# Patient Record
Sex: Female | Born: 1935 | Race: White | Hispanic: No | Smoking: Never smoker
Health system: Southern US, Community
[De-identification: ages and names within clinical notes are randomized; demographics above are authoritative.]

## PROBLEM LIST (undated history)

## (undated) DIAGNOSIS — R12 Heartburn: Secondary | ICD-10-CM

## (undated) DIAGNOSIS — I2510 Atherosclerotic heart disease of native coronary artery without angina pectoris: Secondary | ICD-10-CM

## (undated) DIAGNOSIS — E7800 Pure hypercholesterolemia, unspecified: Secondary | ICD-10-CM

## (undated) DIAGNOSIS — H409 Unspecified glaucoma: Secondary | ICD-10-CM

## (undated) DIAGNOSIS — K469 Unspecified abdominal hernia without obstruction or gangrene: Secondary | ICD-10-CM

## (undated) DIAGNOSIS — N390 Urinary tract infection, site not specified: Secondary | ICD-10-CM

## (undated) DIAGNOSIS — F419 Anxiety disorder, unspecified: Secondary | ICD-10-CM

## (undated) DIAGNOSIS — N19 Unspecified kidney failure: Secondary | ICD-10-CM

## (undated) DIAGNOSIS — C801 Malignant (primary) neoplasm, unspecified: Secondary | ICD-10-CM

## (undated) DIAGNOSIS — I10 Essential (primary) hypertension: Secondary | ICD-10-CM

## (undated) HISTORY — DX: Unspecified glaucoma: H40.9

## (undated) HISTORY — DX: Heartburn: R12

## (undated) HISTORY — DX: Unspecified kidney failure: N19

## (undated) HISTORY — PX: ABDOMINAL HYSTERECTOMY: SUR658

## (undated) HISTORY — PX: COLON SURGERY: SHX602

## (undated) HISTORY — DX: Anxiety disorder, unspecified: F41.9

## (undated) HISTORY — PX: CATARACT EXTRACTION, BILATERAL: SHX1313

## (undated) HISTORY — PX: TOE SURGERY: SHX1073

## (undated) HISTORY — PX: CHOLECYSTECTOMY: SHX55

---

## 2011-09-22 ENCOUNTER — Encounter (HOSPITAL_COMMUNITY): Payer: Self-pay

## 2011-09-22 ENCOUNTER — Inpatient Hospital Stay (HOSPITAL_COMMUNITY)
Admission: EM | Admit: 2011-09-22 | Discharge: 2011-09-23 | DRG: 313 | Disposition: A | Discharge status: Home / Self Care | Payer: Medicare Other | Attending: Family Medicine | Admitting: Family Medicine

## 2011-09-22 ENCOUNTER — Emergency Department (HOSPITAL_COMMUNITY): Payer: Medicare Other

## 2011-09-22 DIAGNOSIS — Z7902 Long term (current) use of antithrombotics/antiplatelets: Secondary | ICD-10-CM

## 2011-09-22 DIAGNOSIS — N390 Urinary tract infection, site not specified: Secondary | ICD-10-CM | POA: Diagnosis present

## 2011-09-22 DIAGNOSIS — E1122 Type 2 diabetes mellitus with diabetic chronic kidney disease: Secondary | ICD-10-CM

## 2011-09-22 DIAGNOSIS — E875 Hyperkalemia: Secondary | ICD-10-CM | POA: Diagnosis present

## 2011-09-22 DIAGNOSIS — R079 Chest pain, unspecified: Secondary | ICD-10-CM

## 2011-09-22 DIAGNOSIS — K449 Diaphragmatic hernia without obstruction or gangrene: Secondary | ICD-10-CM | POA: Diagnosis present

## 2011-09-22 DIAGNOSIS — I2510 Atherosclerotic heart disease of native coronary artery without angina pectoris: Secondary | ICD-10-CM

## 2011-09-22 DIAGNOSIS — N183 Chronic kidney disease, stage 3 unspecified: Secondary | ICD-10-CM

## 2011-09-22 DIAGNOSIS — Z85038 Personal history of other malignant neoplasm of large intestine: Secondary | ICD-10-CM | POA: Insufficient documentation

## 2011-09-22 DIAGNOSIS — E119 Type 2 diabetes mellitus without complications: Secondary | ICD-10-CM | POA: Diagnosis present

## 2011-09-22 DIAGNOSIS — Z87440 Personal history of urinary (tract) infections: Secondary | ICD-10-CM

## 2011-09-22 DIAGNOSIS — Z79899 Other long term (current) drug therapy: Secondary | ICD-10-CM

## 2011-09-22 DIAGNOSIS — R0789 Other chest pain: Principal | ICD-10-CM | POA: Diagnosis present

## 2011-09-22 DIAGNOSIS — F411 Generalized anxiety disorder: Secondary | ICD-10-CM | POA: Diagnosis present

## 2011-09-22 DIAGNOSIS — Z880 Allergy status to penicillin: Secondary | ICD-10-CM

## 2011-09-22 DIAGNOSIS — E785 Hyperlipidemia, unspecified: Secondary | ICD-10-CM

## 2011-09-22 DIAGNOSIS — I129 Hypertensive chronic kidney disease with stage 1 through stage 4 chronic kidney disease, or unspecified chronic kidney disease: Secondary | ICD-10-CM

## 2011-09-22 DIAGNOSIS — D649 Anemia, unspecified: Secondary | ICD-10-CM | POA: Diagnosis present

## 2011-09-22 DIAGNOSIS — Z905 Acquired absence of kidney: Secondary | ICD-10-CM

## 2011-09-22 DIAGNOSIS — K219 Gastro-esophageal reflux disease without esophagitis: Secondary | ICD-10-CM | POA: Diagnosis present

## 2011-09-22 DIAGNOSIS — Z7982 Long term (current) use of aspirin: Secondary | ICD-10-CM

## 2011-09-22 DIAGNOSIS — I517 Cardiomegaly: Secondary | ICD-10-CM | POA: Diagnosis present

## 2011-09-22 DIAGNOSIS — Z9861 Coronary angioplasty status: Secondary | ICD-10-CM

## 2011-09-22 HISTORY — DX: Malignant (primary) neoplasm, unspecified: C80.1

## 2011-09-22 HISTORY — DX: Urinary tract infection, site not specified: N39.0

## 2011-09-22 HISTORY — DX: Unspecified abdominal hernia without obstruction or gangrene: K46.9

## 2011-09-22 HISTORY — DX: Pure hypercholesterolemia, unspecified: E78.00

## 2011-09-22 HISTORY — DX: Essential (primary) hypertension: I10

## 2011-09-22 HISTORY — DX: Atherosclerotic heart disease of native coronary artery without angina pectoris: I25.10

## 2011-09-22 LAB — DIFFERENTIAL
Basophils Absolute: 0 10*3/uL (ref 0.0–0.1)
Basophils Relative: 0 % (ref 0–1)
Eosinophils Absolute: 0.1 10*3/uL (ref 0.0–0.7)
Eosinophils Relative: 2 % (ref 0–5)
Lymphocytes Relative: 28 % (ref 12–46)
Monocytes Absolute: 0.5 10*3/uL (ref 0.1–1.0)

## 2011-09-22 LAB — COMPREHENSIVE METABOLIC PANEL
AST: 17 U/L (ref 0–37)
CO2: 21 mEq/L (ref 19–32)
Calcium: 9.1 mg/dL (ref 8.4–10.5)
Creatinine, Ser: 1.74 mg/dL — ABNORMAL HIGH (ref 0.50–1.10)
GFR calc non Af Amer: 26 mL/min — ABNORMAL LOW (ref >90)
Total Protein: 6.9 g/dL (ref 6.0–8.3)

## 2011-09-22 LAB — CARDIAC PANEL(CRET KIN+CKTOT+MB+TROPI)
CK, MB: 2.7 ng/mL (ref 0.3–4.0)
CK, MB: 2.7 ng/mL (ref 0.3–4.0)
Troponin I: <0.3 ng/mL (ref <0.30)

## 2011-09-22 LAB — CBC
HCT: 30.8 % — ABNORMAL LOW (ref 36.0–46.0)
MCH: 27.5 pg (ref 26.0–34.0)
MCHC: 32.5 g/dL (ref 30.0–36.0)
MCV: 84.6 fL (ref 78.0–100.0)
RDW: 14.8 % (ref 11.5–15.5)

## 2011-09-22 LAB — GLUCOSE, CAPILLARY: Glucose-Capillary: 97 mg/dL (ref 70–99)

## 2011-09-22 MED ORDER — ONDANSETRON HCL 4 MG PO TABS: 4.0000 mg | ORAL_TABLET | Freq: Four times a day (QID) | ORAL | Status: DC | PRN

## 2011-09-22 MED ORDER — SODIUM POLYSTYRENE SULFONATE 15 GM/60ML PO SUSP
15.0000 g | Freq: Once | ORAL | Status: AC
  Administered 2011-09-22: 15 g via ORAL
  Filled 2011-09-22: qty 60

## 2011-09-22 MED ORDER — LORATADINE 10 MG PO TABS
10.0000 mg | ORAL_TABLET | Freq: Every day | ORAL | Status: DC
  Administered 2011-09-23: 10 mg via ORAL
  Filled 2011-09-22: qty 1

## 2011-09-22 MED ORDER — PANTOPRAZOLE SODIUM 40 MG PO TBEC
80.0000 mg | DELAYED_RELEASE_TABLET | Freq: Every day | ORAL | Status: DC
  Administered 2011-09-23: 80 mg via ORAL
  Filled 2011-09-22 (×2): qty 1

## 2011-09-22 MED ORDER — ACETAMINOPHEN 325 MG PO TABS: 650.0000 mg | ORAL_TABLET | Freq: Four times a day (QID) | ORAL | Status: DC | PRN

## 2011-09-22 MED ORDER — CARVEDILOL PHOSPHATE ER 10 MG PO CP24
10.0000 mg | ORAL_CAPSULE | Freq: Every day | ORAL | Status: DC
  Administered 2011-09-23: 10 mg via ORAL
  Filled 2011-09-22 (×2): qty 1

## 2011-09-22 MED ORDER — ONDANSETRON HCL 4 MG/2ML IJ SOLN: 4.0000 mg | Freq: Four times a day (QID) | INTRAMUSCULAR | Status: DC | PRN

## 2011-09-22 MED ORDER — EZETIMIBE-SIMVASTATIN 10-40 MG PO TABS
1.0000 | ORAL_TABLET | Freq: Every day | ORAL | Status: DC
  Administered 2011-09-23: 1 via ORAL
  Filled 2011-09-22 (×2): qty 1

## 2011-09-22 MED ORDER — ALPRAZOLAM 0.25 MG PO TABS: 0.2500 mg | ORAL_TABLET | Freq: Every day | ORAL | Status: DC | PRN

## 2011-09-22 MED ORDER — ASPIRIN 81 MG PO CHEW
81.0000 mg | CHEWABLE_TABLET | Freq: Every day | ORAL | Status: DC
  Administered 2011-09-23: 81 mg via ORAL
  Filled 2011-09-22: qty 1

## 2011-09-22 MED ORDER — ACETAMINOPHEN 650 MG RE SUPP: 650.0000 mg | Freq: Four times a day (QID) | RECTAL | Status: DC | PRN

## 2011-09-22 MED ORDER — MORPHINE SULFATE 2 MG/ML IJ SOLN: 0.5000 mg | INTRAMUSCULAR | Status: DC | PRN

## 2011-09-22 MED ORDER — NITROFURANTOIN MACROCRYSTAL 100 MG PO CAPS: 100.0000 mg | ORAL_CAPSULE | Freq: Two times a day (BID) | ORAL | Status: DC

## 2011-09-22 MED ORDER — ALUM & MAG HYDROXIDE-SIMETH 200-200-20 MG/5ML PO SUSP: 30.0000 mL | Freq: Four times a day (QID) | ORAL | Status: DC | PRN

## 2011-09-22 MED ORDER — CLOPIDOGREL BISULFATE 75 MG PO TABS
75.0000 mg | ORAL_TABLET | Freq: Every day | ORAL | Status: DC
  Administered 2011-09-23: 75 mg via ORAL
  Filled 2011-09-22 (×2): qty 1

## 2011-09-22 MED ORDER — LINAGLIPTIN 5 MG PO TABS
5.0000 mg | ORAL_TABLET | Freq: Every day | ORAL | Status: DC
  Administered 2011-09-23: 5 mg via ORAL
  Filled 2011-09-22: qty 1

## 2011-09-22 MED ORDER — DOCUSATE SODIUM 100 MG PO CAPS
100.0000 mg | ORAL_CAPSULE | Freq: Two times a day (BID) | ORAL | Status: DC
  Administered 2011-09-23: 100 mg via ORAL
  Filled 2011-09-22 (×2): qty 1

## 2011-09-22 MED ORDER — PIOGLITAZONE HCL 45 MG PO TABS
45.0000 mg | ORAL_TABLET | Freq: Every day | ORAL | Status: DC
  Administered 2011-09-23: 45 mg via ORAL
  Filled 2011-09-22 (×2): qty 1

## 2011-09-22 NOTE — H&P (Signed)
Family Medicine Teaching [facility] Admission History and Physical  Patient name: Amy Chavez Medical record number: [id number] Date of birth: [birth year] Age: 78 y.o. Gender: female  Primary Care Provider: Barbette Reichmann, MD in [facility]  Chief Complaint: Chest Pain History of Present Illness: Amy Chavez is a 78 y.o. year old female with a past medical history of CAD s/p PTCI x3 with 1 stent requiring restenting apprxoimately 4 years ago, DM, HLD, HTN, and solitary kidney with CKD stage III presenting with chest pain.   Patient states she was in her normal state of health today including being out shopping with a family member. When she got home from shopping, she reports it was harder for her to get out of the car than normal due to some mild weakness. After getting out of the car, she went inside and within the next hour (sometime around 2:30pm) she started to feel some aching in her shoulders bilaterally. SHe says the discomfort then shifted to the right side of her chest which she described as a 5/10 tightness. She states she was simply sitting down when this pain started. She says if she tried to take a deep breath, it worsened the pain. Otherwise, no SOB or DOE. She describes no nausea/vomiting/palpitations/diaphoresis. She says she did feel slightly weaker and a family member told her she was slightly more pale. She says this felt the same as the time she found out her stent had re stenosed 4-5 years ago.  Family called EMS. Patient reports the tightness eased off but the pain with deep breaths persisted. She was given nitroglycerin on the way to the hospital which she states did not improve her pain. In the ED, patient noted to have a non elevated d-dimer, chest x-ray with cardiomegaly and hiatal hernia, EKG with normal sinus rhythm and no st/t wave changes, and a first set of negative cardiac enzymes. Family Medicine consulted for chest pain rule out admission.    Past medical  history: Patient Active Problem List  Diagnoses  . Chest pain  . Diabetes mellitus type II  . CAD (coronary artery disease)  . HLD (hyperlipidemia)  . CKD (chronic kidney disease), stage III  . Solitary kidney, acquired  . History of colon cancer  . HTN (hypertension)  History of recurrent UTIs with current UTI being treated History of hiatal hernia   Past Surgical History  Procedure Date  . Colon surgery   . Cholecystectomy   . Abdominal hysterectomy     Family History  Problem Relation Age of Onset  . Heart disease Father    Social History:  reports that she has never smoked. She does not have any smokeless tobacco history on file. She reports that she does not drink alcohol or use illicit drugs.Lives in [place] with daughter and grandchildren. Lived here for last 18 months. Patient independent except for driving.   Allergies:  Allergies  Allergen Reactions  . Penicillins Nausea And Vomiting    Medications Prior to Admission  Medication Dose Route Frequency Provider Last Rate Last Dose  . sodium polystyrene (KAYEXALATE) 15 GM/60ML suspension 15 g  15 g Oral Once Everrett Coombe   15 g at 09/22/11 2150   No current outpatient prescriptions on file as of 09/22/2011.    Results for orders placed during the hospital encounter of 09/22/11 (from the past 48 hour(s))  CBC     Status: Abnormal   Collection Time   09/22/11  4:59 PM      Component Value  Range Comment   WBC 5.4  4.0 - 10.5 (K/uL)    RBC 3.64 (*) 3.87 - 5.11 (MIL/uL)    Hemoglobin 10.0 (*) 12.0 - 15.0 (g/dL)    HCT 16.1 (*) 09.6 - 46.0 (%)    MCV 84.6  78.0 - 100.0 (fL)    MCH 27.5  26.0 - 34.0 (pg)    MCHC 32.5  30.0 - 36.0 (g/dL)    RDW 04.5  40.9 - 81.1 (%)    Platelets 203  150 - 400 (K/uL)   DIFFERENTIAL     Status: Normal   Collection Time   09/22/11  4:59 PM      Component Value Range Comment   Neutrophils Relative 61  43 - 77 (%)    Neutro Abs 3.3  1.7 - 7.7 (K/uL)    Lymphocytes Relative 28   12 - 46 (%)    Lymphs Abs 1.5  0.7 - 4.0 (K/uL)    Monocytes Relative 9  3 - 12 (%)    Monocytes Absolute 0.5  0.1 - 1.0 (K/uL)    Eosinophils Relative 2  0 - 5 (%)    Eosinophils Absolute 0.1  0.0 - 0.7 (K/uL)    Basophils Relative 0  0 - 1 (%)    Basophils Absolute 0.0  0.0 - 0.1 (K/uL)   COMPREHENSIVE METABOLIC PANEL     Status: Abnormal   Collection Time   09/22/11  4:59 PM      Component Value Range Comment   Sodium 135  135 - 145 (mEq/L)    Potassium 5.4 (*) 3.5 - 5.1 (mEq/L)    Chloride 106  96 - 112 (mEq/L)    CO2 21  19 - 32 (mEq/L)    Glucose, Bld 110 (*) 70 - 99 (mg/dL)    BUN 35 (*) 6 - 23 (mg/dL)    Creatinine, Ser 9.14 (*) 0.50 - 1.10 (mg/dL)    Calcium 9.1  8.4 - 10.5 (mg/dL)    Total Protein 6.9  6.0 - 8.3 (g/dL)    Albumin 3.3 (*) 3.5 - 5.2 (g/dL)    AST 17  0 - 37 (U/L)    ALT 11  0 - 35 (U/L)    Alkaline Phosphatase 97  39 - 117 (U/L)    Total Bilirubin 0.2 (*) 0.3 - 1.2 (mg/dL)    GFR calc non Af Amer 26 (*) >90 (mL/min)    GFR calc Af Amer 30 (*) >90 (mL/min)   CARDIAC PANEL(CRET KIN+CKTOT+MB+TROPI)     Status: Normal   Collection Time   09/22/11  4:59 PM      Component Value Range Comment   Total CK 142  7 - 177 (U/L)    CK, MB 2.7  0.3 - 4.0 (ng/mL)    Troponin I <0.30  <0.30 (ng/mL)    Relative Index 1.9  0.0 - 2.5    CARDIAC PANEL(CRET KIN+CKTOT+MB+TROPI)     Status: Normal   Collection Time   09/22/11  6:11 PM      Component Value Range Comment   Total CK 156  7 - 177 (U/L) HEMOLYSIS AT THIS LEVEL MAY AFFECT RESULT   CK, MB 2.7  0.3 - 4.0 (ng/mL)    Troponin I <0.30  <0.30 (ng/mL)    Relative Index 1.7  0.0 - 2.5    D-DIMER, QUANTITATIVE     Status: Normal   Collection Time   09/22/11  7:29 PM      Component  Value Range Comment   D-Dimer, Quant 0.47  0.00 - 0.48 (ug/mL-FEU)   GLUCOSE, CAPILLARY     Status: Normal   Collection Time   09/22/11  9:47 PM      Component Value Range Comment   Glucose-Capillary 97  70 - 99 (mg/dL)    Dg Chest  Portable 1 View  09/22/2011  *RADIOLOGY REPORT*  Clinical Data: Right chest pain, shortness of breath, history hypertension, diabetes  PORTABLE CHEST - 1 VIEW  Comparison: Portable exam 1807 hours without priors for comparison  Findings: Enlargement of cardiac silhouette. Density at inferior mediastinum, question large hiatal hernia. Mildly tortuous aorta. Pulmonary vascularity normal. Lungs grossly clear. No pleural effusion or pneumothorax. Bones demineralized.  IMPRESSION: Enlargement of cardiac silhouette. Question large hiatal hernia.  Original Report Authenticated By: Lollie Marrow, M.D.    ROS negative except as noted in HPI    Blood pressure 153/65, pulse 77, resp. rate 23, height 5\' 8"  (1.727 m), weight 178 lb (80.74 kg), SpO2 99.00%. Physical Exam  Constitutional: She is oriented to person, place, and time. She appears well-developed and well-nourished. No distress.       Elderly female appears younger than stated age  HENT:  Mouth/Throat: Oropharynx is clear and moist.       Some plaque build up between front bottom teeth, also with several filled cavities noted  Eyes: Pupils are equal, round, and reactive to light.  Neck: Normal range of motion. Neck supple. No JVD present. No tracheal deviation present. No thyromegaly present.  Cardiovascular: Normal rate and regular rhythm.  Exam reveals no gallop and no friction rub.   Murmur (2/6 systolic ejection murmur best heard in aortic area) heard. Respiratory: Effort normal and breath sounds normal. No respiratory distress. She has no wheezes. She has no rales. She exhibits no tenderness.  GI: Soft. Bowel sounds are normal. She exhibits no distension. There is no tenderness.  Musculoskeletal: Normal range of motion. She exhibits edema (trace).  Neurological: She is alert and oriented to person, place, and time.  Skin: She is not diaphoretic.     Assessment/Plan  Amy Chavez is a 78 y.o. year old female with a past medical history  of CAD s/p PTCI x3 with 1 stent requiring restenting apprxoimately 4 years ago, DM, HLD, HTN admitted for chest pain ACS rule out.   1. Chest Pain with history of CAD-EKG NSR without st/t wave changes. CXR with cardiomegaly and hiatal hernia. D- dimer not elevated. CE negative x 1 -DDX includes ACS, PE (negative d-dimer), MSK, GERD/GI, referred pain from hiatal hernia,  Anxiety (on prn xanax), PTX (none on CXR) -admit to inpatient and monitor on telemetry *if patient without chest pain in AM, would consider outpatient cardiology vs. If continued chest pain, getting inpatient consult for acute eval and to establish cardiology care  *may also try to contact PCP in AM to update  *f/u cardiac enzymes x 2 *AM EKG.  *will get 2-d echo as patient with soft SEM and cardiomegaly on CXR and no echo for 4-5 years with history of CAD  *will also get BNP *continue ASA *patient also on Plavix so will continue at this time but will need cardiology evaluation for continuation *RIsk stratify with A1c, lipids, TSH  2. Asymptomatic hyperkalemia-will give small dose of kayexalate 15mg  and recheck AM labs.   3. CKD Stage III with solitary kidney-patient reports stage III but current GFR consistent with Stage IV. WIll repeat BMET in AM.  4. Anxiety-will continue once daily xanax 0.25mg  prn. Could be playing into current symptoms.   5. HTN-mildly elevated with systolics into 150 in ED> Will continue coreg tonight. Will hold losartan dose this evening as Cr slightly elevated.   6. HLD-continue home statin  7. DM-will continue pioglitazone and lintagliptin tomorrow morning. Will also obtain a1c  8. GERD-continue PPI  FEN/GI: carb modified. SLIV PPx-heparin SQ Code status-will need to address in AM DIsposition-pending resolution of chest pain and further eval    Tana Conch, MD, PGY1  09/22/2011, 10:36 PM  PGY-2 Addendum  I have seen the patient and agree with history, exam and assessment and  plan.  As above needs establishment with cardiology, if chest pain resolves she could probably do this as outpatient.  Holding Losartan for now as she is Stage III CKD.    Everrett Coombe, DO

## 2011-09-22 NOTE — ED Provider Notes (Signed)
History     CSN: [id number]  Arrival date & time 09/22/11  1630   First MD Initiated Contact with Patient 09/22/11 1634      Chief Complaint  Patient presents with  . Chest Pain    (Consider location/radiation/quality/duration/timing/severity/associated sxs/prior treatment) Patient is a 78 y.o. female presenting with chest pain. The history is provided by the patient (Patient complains of chest pain today patient also complains of some weakness and shortness of breath. She states that she has a history of dense. And has not had them looked at for about 5 years). No language interpreter was used.  Chest Pain The chest pain began less than 1 hour ago. Chest pain occurs intermittently. The chest pain is improving. The pain is associated with breathing. At its most intense, the pain is at 5/10. The pain is currently at 4/10. The severity of the pain is moderate. The quality of the pain is described as aching. The pain does not radiate. Pertinent negatives for primary symptoms include no fever, no fatigue, no cough and no abdominal pain. She tried nothing for the symptoms. Risk factors include no known risk factors.  Her past medical history is significant for aneurysm.  Pertinent negatives for past medical history include no seizures.  Pertinent negatives for family medical history include: family history of aortic dissection.     Past Medical History  Diagnosis Date  . Diabetes mellitus   . Cancer   . Coronary artery disease   . Hypertension   . Hernia   . Hypercholesterolemia   . UTI (urinary tract infection)     Past Surgical History  Procedure Date  . Colon surgery     No family history on file.  History  Substance Use Topics  . Smoking status: Never Smoker   . Smokeless tobacco: Not on file  . Alcohol Use: No    OB History    Grav Para Term Preterm Abortions TAB SAB Ect Mult Living                  Review of Systems  Constitutional: Negative for fever and  fatigue.  HENT: Negative for congestion, sinus pressure and ear discharge.   Eyes: Negative for discharge.  Respiratory: Negative for cough.   Cardiovascular: Positive for chest pain.  Gastrointestinal: Negative for abdominal pain and diarrhea.  Genitourinary: Negative for frequency and hematuria.  Musculoskeletal: Negative for back pain.  Skin: Negative for rash.  Neurological: Negative for seizures and headaches.  Hematological: Negative.   Psychiatric/Behavioral: Negative for hallucinations.    Allergies  Penicillins  Home Medications   Current Outpatient Rx  Name Route Sig Dispense Refill  . ALPRAZOLAM 0.25 MG PO TABS Oral Take 0.25 mg by mouth daily as needed. For anxiety    . PRO-STAT RENAL CARE HIGH FIBER PO Oral Take 1 tablet by mouth daily.    . ASPIRIN 81 MG PO CHEW Oral Chew 81 mg by mouth daily.    Marland Kitchen CARVEDILOL PHOSPHATE ER 10 MG PO CP24 Oral Take 10 mg by mouth at bedtime.    . CLOPIDOGREL BISULFATE 75 MG PO TABS Oral Take 75 mg by mouth daily.    . CYANOCOBALAMIN 1000 MCG/ML IJ SOLN Intramuscular Inject 1,000 mcg into the muscle every 30 (thirty) days.    Marland Kitchen ESOMEPRAZOLE MAGNESIUM 40 MG PO CPDR Oral Take 40 mg by mouth daily before breakfast.    . EZETIMIBE-SIMVASTATIN 10-40 MG PO TABS Oral Take 1 tablet by mouth at bedtime.    Marland Kitchen  FERROUS GLUCONATE 325 MG PO TABS Oral Take 325 mg by mouth daily with breakfast.    . FEXOFENADINE HCL 180 MG PO TABS Oral Take 180 mg by mouth daily.    Marland Kitchen LOSARTAN POTASSIUM 25 MG PO TABS Oral Take 25 mg by mouth every other day.    Marland Kitchen NITROFURANTOIN MACROCRYSTAL 100 MG PO CAPS Oral Take 100 mg by mouth 2 (two) times daily.    Marland Kitchen PIOGLITAZONE HCL 45 MG PO TABS Oral Take 45 mg by mouth daily.    Marland Kitchen SITAGLIPTIN PHOSPHATE 100 MG PO TABS Oral Take 100 mg by mouth daily.      BP 138/51  Pulse 69  Resp 13  Ht 5\' 8"  (1.727 m)  Wt 178 lb (80.74 kg)  BMI 27.06 kg/m2  SpO2 100%  Physical Exam  Constitutional: She is oriented to person, place,  and time. She appears well-developed.  HENT:  Head: Normocephalic and atraumatic.  Eyes: Conjunctivae and EOM are normal. No scleral icterus.  Neck: Neck supple. No thyromegaly present.  Cardiovascular: Normal rate and regular rhythm.  Exam reveals no gallop and no friction rub.   No murmur heard. Pulmonary/Chest: No stridor. She has no wheezes. She has no rales. She exhibits no tenderness.  Abdominal: She exhibits no distension. There is no tenderness. There is no rebound.  Musculoskeletal: Normal range of motion. She exhibits no edema.  Lymphadenopathy:    She has no cervical adenopathy.  Neurological: She is oriented to person, place, and time. Coordination normal.  Skin: No rash noted. No erythema.  Psychiatric: She has a normal mood and affect. Her behavior is normal.    ED Course  Procedures (including critical care time)  Labs Reviewed  CBC - Abnormal; Notable for the following:    RBC 3.64 (*)    Hemoglobin 10.0 (*)    HCT 30.8 (*)    All other components within normal limits  COMPREHENSIVE METABOLIC PANEL - Abnormal; Notable for the following:    Potassium 5.4 (*)    Glucose, Bld 110 (*)    BUN 35 (*)    Creatinine, Ser 1.74 (*)    Albumin 3.3 (*)    Total Bilirubin 0.2 (*)    GFR calc non Af Amer 26 (*)    GFR calc Af Amer 30 (*)    All other components within normal limits  DIFFERENTIAL  CARDIAC PANEL(CRET KIN+CKTOT+MB+TROPI)  CARDIAC PANEL(CRET KIN+CKTOT+MB+TROPI)  D-DIMER, QUANTITATIVE   Dg Chest Portable 1 View  09/22/2011  *RADIOLOGY REPORT*  Clinical Data: Right chest pain, shortness of breath, history hypertension, diabetes  PORTABLE CHEST - 1 VIEW  Comparison: Portable exam 1807 hours without priors for comparison  Findings: Enlargement of cardiac silhouette. Density at inferior mediastinum, question large hiatal hernia. Mildly tortuous aorta. Pulmonary vascularity normal. Lungs grossly clear. No pleural effusion or pneumothorax. Bones demineralized.   IMPRESSION: Enlargement of cardiac silhouette. Question large hiatal hernia.  Original Report Authenticated By: Lollie Marrow, M.D.     1. Chest pain     Date: 09/22/2011  Rate: 80  Rhythm: normal sinus rhythm  QRS Axis: normal  Intervals: normal  ST/T Wave abnormalities: normal  Conduction Disutrbances:none  Narrative Interpretation:   Old EKG Reviewed: none available     MDM          Benny Lennert, MD 09/22/11 2100

## 2011-09-22 NOTE — ED Notes (Signed)
Pt reports recent diagnosis of UTI, was placed on macrobid yesterday, has taken 2 doses so far.  Pt reports pain is 5/10, reports pain radiates from chest to scapula intermittently, reports pain increases with deep inspiration, pain is not re-producible with palpation.

## 2011-09-22 NOTE — ED Notes (Signed)
Pt resting quietly with family at bedside. Pt denies any pain or shortness of breath at this time. Plan of care is updated with verbal understanding. Pt is awaiting test results for final disposition. Call bell in reach, will continue to monitor pt.

## 2011-09-22 NOTE — ED Notes (Signed)
[id number] Ready 

## 2011-09-22 NOTE — ED Notes (Signed)
Attempted to call report, RN unavailable. RN Merlyn Albert to call back for pt report.

## 2011-09-22 NOTE — ED Notes (Signed)
Pt presents with onset of midscapular pain that radiated into R side of chest and into L side. -shortness of breath or nausea.  Pt reports she was sitting at the time, reports onset of weakness while getting out of car and had to get assistance to get out.

## 2011-09-22 NOTE — ED Notes (Signed)
Pt ambulatory to restroom with steady gait, pt denies any complaints or pain upon return to room. Pt placed back on cardiac monitoring and request not to be on oxygen at this time. Pt sats 98% on RA. Pt CBG was checked with results of 97, pt given meal tray. Plan of care is updated with verbal understanding, pt is awaiting inpt bed assignment. Will continue to monitor pt.

## 2011-09-23 ENCOUNTER — Encounter (HOSPITAL_COMMUNITY): Payer: Self-pay | Admitting: *Deleted

## 2011-09-23 ENCOUNTER — Other Ambulatory Visit: Payer: Self-pay

## 2011-09-23 DIAGNOSIS — R0789 Other chest pain: Secondary | ICD-10-CM

## 2011-09-23 DIAGNOSIS — N189 Chronic kidney disease, unspecified: Secondary | ICD-10-CM

## 2011-09-23 DIAGNOSIS — I2510 Atherosclerotic heart disease of native coronary artery without angina pectoris: Secondary | ICD-10-CM

## 2011-09-23 DIAGNOSIS — E1029 Type 1 diabetes mellitus with other diabetic kidney complication: Secondary | ICD-10-CM

## 2011-09-23 DIAGNOSIS — I369 Nonrheumatic tricuspid valve disorder, unspecified: Secondary | ICD-10-CM

## 2011-09-23 LAB — BASIC METABOLIC PANEL
Chloride: 109 mEq/L (ref 96–112)
Creatinine, Ser: 1.69 mg/dL — ABNORMAL HIGH (ref 0.50–1.10)
GFR calc Af Amer: 31 mL/min — ABNORMAL LOW (ref >90)

## 2011-09-23 LAB — CBC
HCT: 30.4 % — ABNORMAL LOW (ref 36.0–46.0)
Hemoglobin: 9.5 g/dL — ABNORMAL LOW (ref 12.0–15.0)
Hemoglobin: 9.6 g/dL — ABNORMAL LOW (ref 12.0–15.0)
MCHC: 31.7 g/dL (ref 30.0–36.0)
Platelets: 196 10*3/uL (ref 150–400)
RDW: 14.8 % (ref 11.5–15.5)
RDW: 14.9 % (ref 11.5–15.5)
WBC: 4.7 10*3/uL (ref 4.0–10.5)

## 2011-09-23 LAB — CARDIAC PANEL(CRET KIN+CKTOT+MB+TROPI)
CK, MB: 2.6 ng/mL (ref 0.3–4.0)
CK, MB: 2.6 ng/mL (ref 0.3–4.0)
Troponin I: <0.3 ng/mL (ref <0.30)

## 2011-09-23 LAB — CREATININE, SERUM
GFR calc Af Amer: 28 mL/min — ABNORMAL LOW (ref >90)
GFR calc non Af Amer: 24 mL/min — ABNORMAL LOW (ref >90)

## 2011-09-23 LAB — HEMOGLOBIN A1C
Hgb A1c MFr Bld: 6.4 % — ABNORMAL HIGH (ref <5.7)
Mean Plasma Glucose: 137 mg/dL — ABNORMAL HIGH (ref <117)

## 2011-09-23 LAB — GLUCOSE, CAPILLARY
Glucose-Capillary: 102 mg/dL — ABNORMAL HIGH (ref 70–99)
Glucose-Capillary: 104 mg/dL — ABNORMAL HIGH (ref 70–99)

## 2011-09-23 LAB — PRO B NATRIURETIC PEPTIDE: Pro B Natriuretic peptide (BNP): 295.8 pg/mL (ref 0–450)

## 2011-09-23 LAB — LIPID PANEL
Cholesterol: 123 mg/dL (ref 0–200)
Triglycerides: 87 mg/dL (ref <150)

## 2011-09-23 MED ORDER — CEPHALEXIN 500 MG PO CAPS: 500.0000 mg | ORAL_CAPSULE | Freq: Three times a day (TID) | ORAL | Status: AC

## 2011-09-23 MED ORDER — HEPARIN SODIUM (PORCINE) 5000 UNIT/ML IJ SOLN
5000.0000 [IU] | Freq: Three times a day (TID) | INTRAMUSCULAR | Status: DC
  Administered 2011-09-23: 5000 [IU] via SUBCUTANEOUS
  Filled 2011-09-23 (×4): qty 1

## 2011-09-23 MED ORDER — CEPHALEXIN 500 MG PO CAPS: 500.0000 mg | ORAL_CAPSULE | Freq: Three times a day (TID) | ORAL | Status: DC

## 2011-09-23 NOTE — Progress Notes (Signed)
Echocardiogram 2D Echocardiogram has been performed.  Amy Chavez 09/23/2011, 12:18 PM

## 2011-09-23 NOTE — Discharge Summary (Signed)
Physician Discharge Summary  Patient ID: Amy Chavez MRN: [id number] DOB: 1925/12/02 Age: 78 y.o.  Admit date: 09/22/2011 Discharge date: 09/25/2011  PCP: Barbette Reichmann, MD of [place], [place]     Discharge Diagnosis: Principal Problem:  *Chest pain Active Problems:  Diabetes mellitus type II  CAD (coronary artery disease)  HLD (hyperlipidemia)  CKD (chronic kidney disease), stage III  Solitary kidney, acquired  HTN (hypertension)    Hospital Course Amy Chavez is a 78 y.o. year old female with a past medical history of CAD s/p PTCI x3 with 1 stent requiring restenting approximately 4 years ago, DM, HLD, HTN admitted for chest pain ACS rule out.  1. Chest Pain with history of CAD-EKG NSR without st/t wave changes. CXR with cardiomegaly and hiatal hernia. D- dimer not elevated. CE negative x 3. Resolved on night of admission. Nitro without relief in ED.  -DDX includes ACS, PE (negative d-dimer), MSK, GERD/GI, referred pain from hiatal hernia, Anxiety (on prn xanax), PTX (none on CXR). Would favor MSK at this point given primarily with deep inspiration and patient admitting to some anxiety associated with feeling the pain, but patient would still benefit from being established with cardiology.  *attempted to contact PCP to see if he would want [place] to establish cardiology care in hospital but PCP was unavailable.  *2-d echo ordered given history of CAD and SEM: echo with EF 55-60%, no mention of diastolic dysfunction and no valve abnormalities. Patient left before 2-d echo results finalized. I have routed a copy of the echo to the PCP as well as included in this document *continue ASA  *patient also on Plavix so will continue at this time but will need cardiology evaluation for continuation  *LDL well controlled at 44, TSH wnl, A1c 6.4. Also had BNP not elevated at <300   2. Asymptomatic hyperkalemia-resolved with small dose of kayexalate 15mg   3. CKD Stage III with solitary  kidney-patient reports stage III but current GFR consistent with Stage IV. Cr at admission was 1.74 now 1.69. Will not send patient home on januvia and losartan and patient to f/u with PCP on Monday.  4. Anxiety-will continue once daily xanax 0.25mg  prn. Could be playing into current chest pain symptoms.  5. HTN-controlled this morning at 131/58. On coreg. Will continue to hold losartan due to CKD.  6. HLD-continue home statin. Well controlled  7. DM-will continue pioglitazone and lintagliptinin house.  Lab Results  Component Value Date   HGBA1C 6.4* 09/23/2011   8. GERD-continue PPI  9. Normocytic Anemia-unchanged since admission-will need to monitor outpatient.  10. Outpatient UTI-did continue nitrofurantoin initially but crcl less than 50 so stopped. Called PCP and no culture available so switched to Keflex.     Procedures/Imaging:  Dg Chest Portable 1 View  09/22/2011  *RADIOLOGY REPORT*  Clinical Data: Right chest pain, shortness of breath, history hypertension, diabetes  PORTABLE CHEST - 1 VIEW  Comparison: Portable exam 1807 hours without priors for comparison  Findings: Enlargement of cardiac silhouette. Density at inferior mediastinum, question large hiatal hernia. Mildly tortuous aorta. Pulmonary vascularity normal. Lungs grossly clear. No pleural effusion or pneumothorax. Bones demineralized.  IMPRESSION: Enlargement of cardiac silhouette. Question large hiatal hernia.  Original Report Authenticated By: Lollie Marrow, M.D.   ------------------------------------------------------------ 2-D Echo Study Conclusions  - Left ventricle: The cavity size was normal. Systolic function was normal. The estimated ejection fraction was in the range of 55% to 60%. Wall motion was normal; there were no regional wall motion abnormalities. -  Aortic valve: Not well seen likely trileaflet - Atrial septum: No defect or patent foramen ovale was identified. Transthoracic echocardiography. M-mode,  complete 2D, spectral Doppler, and color Doppler. Height: Height: 172.7cm. Height: 68in. Weight: Weight: 81.6kg. Weight: 179.6lb. Body mass index: BMI: 27.4kg/m^2. Body surface area: BSA: 1.76m^2. Blood pressure: 137/72. Patient status: Inpatient. Location: Echo laboratory.  ------------------------------------------------------------  ------------------------------------------------------------ Left ventricle: The cavity size was normal. Systolic function was normal. The estimated ejection fraction was in the range of 55% to 60%. Wall motion was normal; there were no regional wall motion abnormalities.  ------------------------------------------------------------ Aortic valve: Not well seen likely trileaflet Trileaflet; normal thickness, mildly calcified leaflets. Mobility was not restricted. Doppler: Transvalvular velocity was within the normal range. There was no stenosis. No regurgitation.  ------------------------------------------------------------ Aorta: The aorta was normal, not dilated, and non-diseased. Aortic root: The aortic root was normal in size.  ------------------------------------------------------------ Mitral valve: Structurally normal valve. Mobility was not restricted. Doppler: Transvalvular velocity was within the normal range. There was no evidence for stenosis. Trivial regurgitation.  ------------------------------------------------------------ Left atrium: The atrium was normal in size.  ------------------------------------------------------------ Atrial septum: No defect or patent foramen ovale was identified.  ------------------------------------------------------------ Right ventricle: The cavity size was normal. Wall thickness was normal. Systolic function was normal.  ------------------------------------------------------------ Pulmonic valve: Doppler: Transvalvular velocity was within the normal range. There was no evidence for  stenosis. Trivial regurgitation.  ------------------------------------------------------------ Tricuspid valve: Structurally normal valve. Doppler: Transvalvular velocity was within the normal range. Mild regurgitation.  ------------------------------------------------------------ Pulmonary artery: The main pulmonary artery was normal-sized. Systolic pressure was within the normal range.  ------------------------------------------------------------ Right atrium: The atrium was normal in size.  ------------------------------------------------------------ Pericardium: The pericardium was normal in appearance. There was no pericardial effusion.  ------------------------------------------------------------ Systemic veins: Inferior vena cava: The vessel was normal in size.  ------------------------------------------------------------ Post procedure conclusions Ascending Aorta:  - The aorta was normal, not dilated, and non-diseased.     Labs  CBC  Lab 09/23/11 0440 09/23/11 0003 09/22/11 1659  WBC 4.7 4.7 5.4  HGB 9.6* 9.5* 10.0*  HCT 30.3* 30.4* 30.8*  PLT 196 198 203   BMET  Lab 09/23/11 0440 09/23/11 0003 09/22/11 1659  NA 139 -- 135  K 4.6 -- 5.4*  CL 109 -- 106  CO2 23 -- 21  BUN 33* -- 35*  CREATININE 1.69* 1.83* 1.74*  CALCIUM 9.1 -- 9.1  PROT -- -- 6.9  BILITOT -- -- 0.2*  ALKPHOS -- -- 97  ALT -- -- 11  AST -- -- 17  GLUCOSE 76 -- 110*   Results for orders placed during the hospital encounter of 09/22/11 (from the past 72 hour(s))  CBC     Status: Abnormal   Collection Time   09/22/11  4:59 PM      Component Value Range Comment   WBC 5.4  4.0 - 10.5 (K/uL)    RBC 3.64 (*) 3.87 - 5.11 (MIL/uL)    Hemoglobin 10.0 (*) 12.0 - 15.0 (g/dL)    HCT 47.8 (*) 29.5 - 46.0 (%)    MCV 84.6  78.0 - 100.0 (fL)    MCH 27.5  26.0 - 34.0 (pg)    MCHC 32.5  30.0 - 36.0 (g/dL)    RDW 62.1  30.8 - 65.7 (%)    Platelets 203  150 - 400 (K/uL)   DIFFERENTIAL      Status: Normal   Collection Time   09/22/11  4:59 PM      Component Value Range Comment   Neutrophils  Relative 61  43 - 77 (%)    Neutro Abs 3.3  1.7 - 7.7 (K/uL)    Lymphocytes Relative 28  12 - 46 (%)    Lymphs Abs 1.5  0.7 - 4.0 (K/uL)    Monocytes Relative 9  3 - 12 (%)    Monocytes Absolute 0.5  0.1 - 1.0 (K/uL)    Eosinophils Relative 2  0 - 5 (%)    Eosinophils Absolute 0.1  0.0 - 0.7 (K/uL)    Basophils Relative 0  0 - 1 (%)    Basophils Absolute 0.0  0.0 - 0.1 (K/uL)   COMPREHENSIVE METABOLIC PANEL     Status: Abnormal   Collection Time   09/22/11  4:59 PM      Component Value Range Comment   Sodium 135  135 - 145 (mEq/L)    Potassium 5.4 (*) 3.5 - 5.1 (mEq/L)    Chloride 106  96 - 112 (mEq/L)    CO2 21  19 - 32 (mEq/L)    Glucose, Bld 110 (*) 70 - 99 (mg/dL)    BUN 35 (*) 6 - 23 (mg/dL)    Creatinine, Ser 8.46 (*) 0.50 - 1.10 (mg/dL)    Calcium 9.1  8.4 - 10.5 (mg/dL)    Total Protein 6.9  6.0 - 8.3 (g/dL)    Albumin 3.3 (*) 3.5 - 5.2 (g/dL)    AST 17  0 - 37 (U/L)    ALT 11  0 - 35 (U/L)    Alkaline Phosphatase 97  39 - 117 (U/L)    Total Bilirubin 0.2 (*) 0.3 - 1.2 (mg/dL)    GFR calc non Af Amer 26 (*) >90 (mL/min)    GFR calc Af Amer 30 (*) >90 (mL/min)   CARDIAC PANEL(CRET KIN+CKTOT+MB+TROPI)     Status: Normal   Collection Time   09/22/11  4:59 PM      Component Value Range Comment   Total CK 142  7 - 177 (U/L)    CK, MB 2.7  0.3 - 4.0 (ng/mL)    Troponin I <0.30  <0.30 (ng/mL)    Relative Index 1.9  0.0 - 2.5    CARDIAC PANEL(CRET KIN+CKTOT+MB+TROPI)     Status: Normal   Collection Time   09/22/11  6:11 PM      Component Value Range Comment   Total CK 156  7 - 177 (U/L) HEMOLYSIS AT THIS LEVEL MAY AFFECT RESULT   CK, MB 2.7  0.3 - 4.0 (ng/mL)    Troponin I <0.30  <0.30 (ng/mL)    Relative Index 1.7  0.0 - 2.5    D-DIMER, QUANTITATIVE     Status: Normal   Collection Time   09/22/11  7:29 PM      Component Value Range Comment   D-Dimer, Quant 0.47  0.00  - 0.48 (ug/mL-FEU)   GLUCOSE, CAPILLARY     Status: Normal   Collection Time   09/22/11  9:47 PM      Component Value Range Comment   Glucose-Capillary 97  70 - 99 (mg/dL)   CBC     Status: Abnormal   Collection Time   09/23/11 12:03 AM      Component Value Range Comment   WBC 4.7  4.0 - 10.5 (K/uL)    RBC 3.56 (*) 3.87 - 5.11 (MIL/uL)    Hemoglobin 9.5 (*) 12.0 - 15.0 (g/dL)    HCT 96.2 (*) 95.2 - 46.0 (%)    MCV 85.4  78.0 - 100.0 (fL)    MCH 26.7  26.0 - 34.0 (pg)    MCHC 31.3  30.0 - 36.0 (g/dL)    RDW 16.1  09.6 - 04.5 (%)    Platelets 198  150 - 400 (K/uL)   CREATININE, SERUM     Status: Abnormal   Collection Time   09/23/11 12:03 AM      Component Value Range Comment   Creatinine, Ser 1.83 (*) 0.50 - 1.10 (mg/dL)    GFR calc non Af Amer 24 (*) >90 (mL/min)    GFR calc Af Amer 28 (*) >90 (mL/min)   PRO B NATRIURETIC PEPTIDE     Status: Normal   Collection Time   09/23/11 12:03 AM      Component Value Range Comment   Pro B Natriuretic peptide (BNP) 295.8  0 - 450 (pg/mL)   TSH     Status: Normal   Collection Time   09/23/11 12:03 AM      Component Value Range Comment   TSH 2.642  0.350 - 4.500 (uIU/mL)   HEMOGLOBIN A1C     Status: Abnormal   Collection Time   09/23/11 12:03 AM      Component Value Range Comment   Hemoglobin A1C 6.4 (*) <5.7 (%)    Mean Plasma Glucose 137 (*) <117 (mg/dL)   BASIC METABOLIC PANEL     Status: Abnormal   Collection Time   09/23/11  4:40 AM      Component Value Range Comment   Sodium 139  135 - 145 (mEq/L)    Potassium 4.6  3.5 - 5.1 (mEq/L)    Chloride 109  96 - 112 (mEq/L)    CO2 23  19 - 32 (mEq/L)    Glucose, Bld 76  70 - 99 (mg/dL)    BUN 33 (*) 6 - 23 (mg/dL)    Creatinine, Ser 4.09 (*) 0.50 - 1.10 (mg/dL)    Calcium 9.1  8.4 - 10.5 (mg/dL)    GFR calc non Af Amer 27 (*) >90 (mL/min)    GFR calc Af Amer 31 (*) >90 (mL/min)   CARDIAC PANEL(CRET KIN+CKTOT+MB+TROPI)     Status: Normal   Collection Time   09/23/11  4:40 AM       Component Value Range Comment   Total CK 132  7 - 177 (U/L)    CK, MB 2.6  0.3 - 4.0 (ng/mL)    Troponin I <0.30  <0.30 (ng/mL)    Relative Index 2.0  0.0 - 2.5    CBC     Status: Abnormal   Collection Time   09/23/11  4:40 AM      Component Value Range Comment   WBC 4.7  4.0 - 10.5 (K/uL)    RBC 3.59 (*) 3.87 - 5.11 (MIL/uL)    Hemoglobin 9.6 (*) 12.0 - 15.0 (g/dL)    HCT 81.1 (*) 91.4 - 46.0 (%)    MCV 84.4  78.0 - 100.0 (fL)    MCH 26.7  26.0 - 34.0 (pg)    MCHC 31.7  30.0 - 36.0 (g/dL)    RDW 78.2  95.6 - 21.3 (%)    Platelets 196  150 - 400 (K/uL)   LIPID PANEL     Status: Normal   Collection Time   09/23/11  4:40 AM      Component Value Range Comment   Cholesterol 123  0 - 200 (mg/dL)    Triglycerides 87  <086 (mg/dL)  HDL 62  >39 (mg/dL)    Total CHOL/HDL Ratio 2.0      VLDL 17  0 - 40 (mg/dL)    LDL Cholesterol 44  0 - 99 (mg/dL)   GLUCOSE, CAPILLARY     Status: Normal   Collection Time   09/23/11  6:07 AM      Component Value Range Comment   Glucose-Capillary 75  70 - 99 (mg/dL)    Comment 1 Notify RN     CARDIAC PANEL(CRET KIN+CKTOT+MB+TROPI)     Status: Normal   Collection Time   09/23/11  8:23 AM      Component Value Range Comment   Total CK 133  7 - 177 (U/L)    CK, MB 2.6  0.3 - 4.0 (ng/mL)    Troponin I <0.30  <0.30 (ng/mL)    Relative Index 2.0  0.0 - 2.5    GLUCOSE, CAPILLARY     Status: Abnormal   Collection Time   09/23/11 12:00 PM      Component Value Range Comment   Glucose-Capillary 102 (*) 70 - 99 (mg/dL)   GLUCOSE, CAPILLARY     Status: Abnormal   Collection Time   09/23/11  3:58 PM      Component Value Range Comment   Glucose-Capillary 104 (*) 70 - 99 (mg/dL)    Comment 1 Notify RN          Patient condition at time of discharge/disposition: stable  Disposition-home   Follow up issues: 1. januvia and losartan held due to Cr elevated with GFR noted at Stage IV level. Please check BMET before restarting medications. Also switched patient  to keflex from nitrofurantoin for UTI due to CrCl <50. If GFR has truly progressed to stage IV, may benefit from consult with renal.  2. Please have patient established with cardiologist in [facility]. History of CAD with 3 stents without cardiology follow up.  3. She is still on Plavix and unsure of current indication for continuation given last stent placed over 5 years ago.    Discharge follow up:  Follow-up Information    Follow up with [facility] on 09/26/2011. (fior your regularly scheduled appointment and hospital follow up)    Contact information:   [address] [place] [address] [phone]         Discharge Orders    Future Orders Please Complete By Expires   Diet - low sodium heart healthy      Increase activity slowly      Call MD for:  persistant nausea and vomiting      Call MD for:      Scheduling Instructions:   New chest pain.      Discharge Medications  Amy Chavez, Amy Chavez  Home Medication Instructions [id number]   Printed on:09/25/11 1623  Medication Information                    aspirin 81 MG chewable tablet Chew 81 mg by mouth daily.           clopidogrel (PLAVIX) 75 MG tablet Take 75 mg by mouth daily.           pioglitazone (ACTOS) 45 MG tablet Take 45 mg by mouth daily.           ezetimibe-simvastatin (VYTORIN) 10-40 MG per tablet Take 1 tablet by mouth at bedtime.           carvedilol (COREG CR) 10 MG 24 hr capsule Take 10  mg by mouth at bedtime.           ferrous gluconate (FERGON) 325 MG tablet Take 325 mg by mouth daily with breakfast.           fexofenadine (ALLEGRA) 180 MG tablet Take 180 mg by mouth daily.           esomeprazole (NEXIUM) 40 MG capsule Take 40 mg by mouth daily before breakfast.           cyanocobalamin (,VITAMIN B-12,) 1000 MCG/ML injection Inject 1,000 mcg into the muscle every 30 (thirty) days.           ALPRAZolam (XANAX) 0.25 MG tablet Take 0.25 mg by mouth daily as  needed. For anxiety           Amino Acids-Protein Hydrolys (PRO-STAT RENAL CARE HIGH FIBER PO) Take 1 tablet by mouth daily.           cephALEXin (KEFLEX) 500 MG capsule Take 1 capsule (500 mg total) by mouth 3 (three) times daily.                Tana Conch, MD of [facility] 09/25/2011 4:23 PM

## 2011-09-23 NOTE — Progress Notes (Signed)
I discussed the care plan with Dr. Durene Cal and the [facility] team and agree with assessment and plan as documented in the progress note for today.    Johnanthony Wilden A. [name], MD Family Medicine Teaching Service Attending  09/23/2011 7:37 PM

## 2011-09-23 NOTE — H&P (Signed)
Family Medicine Teaching Service Attending Note  I interviewed and examined patient Amy Chavez and reviewed their tests and x-rays.  I discussed with Dr. Jerolyn Center and reviewed their note for today.  I agree with their assessment and plan.     Additionally  Currently feels well no shortness of breath or chest pain Ate all breakfast Agree with plan.   If can ambulate without pain and all labs are normal can discharge other wise need to consult cardiology in patient  Suggest she establish care with a cardiologist either in [place] or [place] ASAP

## 2011-09-23 NOTE — Progress Notes (Signed)
09/23/11  Nursing 1716 DC IV, DC Tele, DC Home. Discharge instructions and home medications discussed with patient. Patient denies any questions or concern at this time. Patient leaving unit ambulatory and appears in no acute distress. Ernesta Amble, RN

## 2011-09-23 NOTE — Progress Notes (Signed)
PGY-1 Daily Progress Note Family Medicine Teaching Service Aldine Contes. Marti Sleigh, MD Service Pager: [phone]   Subjective: No chest pain this morning even with deep inspiration. Says went away around 10:30pm last night.   Objective:  Temp:  [97.5 F (36.4 C)-97.9 F (36.6 C)] 97.5 F (36.4 C) (01/11 0611) Pulse Rate:  [67-80] 67  (01/11 0611) Resp:  [12-23] 18  (01/11 0611) BP: (131-157)/(47-65) 131/58 mmHg (01/11 0611) SpO2:  [98 %-100 %] 98 % (01/11 0611) Weight:  [178 lb (80.74 kg)-180 lb 12.4 oz (82 kg)] 180 lb 5.4 oz (81.8 kg) (01/11 0233)  Intake/Output Summary (Last 24 hours) at 09/23/11 0759 Last data filed at 09/23/11 0700  Gross per 24 hour  Intake    600 ml  Output    300 ml  Net    300 ml    Constitutional: She is oriented to person, place, and time. She appears well-developed and well-nourished. No distress. No longer on oxygen Elderly female appears younger than stated age  HEENT: MMM Neck: Normal range of motion. Neck supple. No JVD present. No tracheal deviation present. No thyromegaly present.  Cardiovascular: Normal rate and regular rhythm. Exam reveals no gallop and no friction rub.  Murmur (2/6 systolic ejection murmur best heard in aortic area) heard.  Respiratory: Effort normal and breath sounds normal. No respiratory distress. She has no wheezes. She has no rales. She exhibits no tenderness.  GI: Soft. Bowel sounds are normal. She exhibits no distension. There is no tenderness.  Musculoskeletal: Normal range of motion. She exhibits edema (trace).  Neurological: She is alert and oriented to person, place, and time.    Labs and imaging:   CBC  Lab 09/23/11 0440 09/23/11 0003 09/22/11 1659  WBC 4.7 4.7 5.4  HGB 9.6* 9.5* 10.0*  HCT 30.3* 30.4* 30.8*  PLT 196 198 203   BMET  Lab 09/23/11 0440 09/23/11 0003 09/22/11 1659  NA 139 -- 135  K 4.6 -- 5.4*  CL 109 -- 106  CO2 23 -- 21  BUN 33* -- 35*  CREATININE 1.69* 1.83* 1.74*  LABGLOM -- -- --   GLUCOSE 76 -- --  CALCIUM 9.1 -- 9.1    Lab 09/23/11 0440 09/22/11 1811 09/22/11 1659  CKTOTAL 132 156 142  TROPONINI <0.30 <0.30 <0.30  TROPONINT -- -- --  CKMBINDEX -- -- --    D-DIMER, QUANTITATIVE     Status: Normal   Collection Time   09/22/11  7:29 PM      Component Value Range   D-Dimer, Quant 0.47  0.00 - 0.48 (ug/mL-FEU)  PRO B NATRIURETIC PEPTIDE     Status: Normal   Collection Time   09/23/11 12:03 AM      Component Value Range   Pro B Natriuretic peptide (BNP) 295.8  0 - 450 (pg/mL)  LIPID PANEL     Status: Normal   Collection Time   09/23/11  4:40 AM      Component Value Range   Cholesterol 123  0 - 200 (mg/dL)   Triglycerides 87  <829 (mg/dL)   HDL 62  >56 (mg/dL)   Total CHOL/HDL Ratio 2.0     VLDL 17  0 - 40 (mg/dL)   LDL Cholesterol 44  0 - 99 (mg/dL)   Dg Chest Portable 1 View  09/22/2011  *RADIOLOGY REPORT*  Clinical Data: Right chest pain, shortness of breath, history hypertension, diabetes  PORTABLE CHEST - 1 VIEW  Comparison: Portable exam 1807 hours without priors  for comparison  Findings: Enlargement of cardiac silhouette. Density at inferior mediastinum, question large hiatal hernia. Mildly tortuous aorta. Pulmonary vascularity normal. Lungs grossly clear. No pleural effusion or pneumothorax. Bones demineralized.  IMPRESSION: Enlargement of cardiac silhouette. Question large hiatal hernia.  Original Report Authenticated By: Lollie Marrow, M.D.     Assessment  Kayler Buckholtz is a 78 y.o. year old female with a past medical history of CAD s/p PTCI x3 with 1 stent requiring restenting apprxoimately 4 years ago, DM, HLD, HTN admitted for chest pain ACS rule out.   1. Chest Pain with history of CAD-EKG NSR without st/t wave changes. CXR with cardiomegaly and hiatal hernia. D- dimer not elevated. CE negative x 3 -DDX includes ACS, PE (negative d-dimer), MSK, GERD/GI, referred pain from hiatal hernia, Anxiety (on prn xanax), PTX (none on CXR). Would favor MSK  at this point given primarily with deep inspiration and patient admitting to some anxiety associated with feeling the pain, but patient would still benefit from being established with cardiology.   *will attempt to contact PCP and recommend outpatient cardiology set up.   *f/u cardiac enzymes x 1 as first 2 were spaced within an hour *will get 2-d echo as patient with soft SEM and cardiomegaly on CXR and no echo for 4-5 years with history of CAD. Will not hold patient for results.  *continue ASA  *patient also on Plavix so will continue at this time but will need cardiology evaluation for continuation  *RIsk stratify with A1c, lipids, TSH. Also had BNP not elevated at <300  *LDL well controlled at 62  *TSH, A1c pending 2. Asymptomatic hyperkalemia-resolved with small dose of kayexalate 15mg   3. CKD Stage III with solitary kidney-patient reports stage III but current GFR consistent with Stage IV. Cr at admission was 1.74 now 1.69. Will not send patient home on januvia and losartan and patient to f/u with PCP on Monday.   4. Anxiety-will continue once daily xanax 0.25mg  prn. Could be playing into current symptoms.  5. HTN-controlled this morning at 131/58. On coreg. Will continue to hold losartan 6. HLD-continue home statin. Well controlled 7. DM-will continue pioglitazone and lintagliptinin house. pending a1c  8. GERD-continue PPI  9. Normocytic Anemia-unchanged since admission-will need to monitor outpatient.  10. Outpatient UTI-did continue nitrofurantoin initially but crcl less than 50 so stopped. Will try to discuss with pcp if patient had culture and treat with keflex. Patient reports being symptomatic recently.   FEN/GI: carb modified. SLIV  PPx-heparin SQ  DIsposition-likely d/c home today   Tana Conch, MD PGY1, Family Medicine Teaching Service [phone]

## 2011-09-25 NOTE — Discharge Summary (Signed)
I interviewed and examined this patient and discussed the care plan with Dr. Durene Cal and the [facility] team and agree with assessment and plan as documented in the discharge note for today.    Jazzmin Newbold A. [name], MD Family Medicine Teaching Service Attending  09/25/2011 9:56 PM

## 2012-06-26 ENCOUNTER — Emergency Department (HOSPITAL_COMMUNITY): Payer: Medicare Other

## 2012-06-26 ENCOUNTER — Observation Stay (HOSPITAL_COMMUNITY)
Admission: EM | Admit: 2012-06-26 | Discharge: 2012-06-28 | Disposition: A | Discharge status: Home / Self Care | Payer: Medicare Other | Attending: Internal Medicine | Admitting: Internal Medicine

## 2012-06-26 ENCOUNTER — Encounter (HOSPITAL_COMMUNITY): Payer: Self-pay | Admitting: *Deleted

## 2012-06-26 DIAGNOSIS — Z7982 Long term (current) use of aspirin: Secondary | ICD-10-CM | POA: Insufficient documentation

## 2012-06-26 DIAGNOSIS — E785 Hyperlipidemia, unspecified: Secondary | ICD-10-CM

## 2012-06-26 DIAGNOSIS — Z23 Encounter for immunization: Secondary | ICD-10-CM | POA: Insufficient documentation

## 2012-06-26 DIAGNOSIS — E1122 Type 2 diabetes mellitus with diabetic chronic kidney disease: Secondary | ICD-10-CM | POA: Diagnosis present

## 2012-06-26 DIAGNOSIS — I10 Essential (primary) hypertension: Secondary | ICD-10-CM

## 2012-06-26 DIAGNOSIS — I2510 Atherosclerotic heart disease of native coronary artery without angina pectoris: Secondary | ICD-10-CM | POA: Diagnosis present

## 2012-06-26 DIAGNOSIS — Z905 Acquired absence of kidney: Secondary | ICD-10-CM

## 2012-06-26 DIAGNOSIS — K219 Gastro-esophageal reflux disease without esophagitis: Secondary | ICD-10-CM | POA: Insufficient documentation

## 2012-06-26 DIAGNOSIS — I129 Hypertensive chronic kidney disease with stage 1 through stage 4 chronic kidney disease, or unspecified chronic kidney disease: Secondary | ICD-10-CM | POA: Diagnosis present

## 2012-06-26 DIAGNOSIS — E7800 Pure hypercholesterolemia, unspecified: Secondary | ICD-10-CM | POA: Insufficient documentation

## 2012-06-26 DIAGNOSIS — N183 Chronic kidney disease, stage 3 unspecified: Secondary | ICD-10-CM | POA: Diagnosis present

## 2012-06-26 DIAGNOSIS — Z85038 Personal history of other malignant neoplasm of large intestine: Secondary | ICD-10-CM

## 2012-06-26 DIAGNOSIS — E119 Type 2 diabetes mellitus without complications: Principal | ICD-10-CM | POA: Insufficient documentation

## 2012-06-26 DIAGNOSIS — R079 Chest pain, unspecified: Secondary | ICD-10-CM | POA: Diagnosis present

## 2012-06-26 DIAGNOSIS — Z79899 Other long term (current) drug therapy: Secondary | ICD-10-CM | POA: Insufficient documentation

## 2012-06-26 LAB — POCT I-STAT TROPONIN I: Troponin i, poc: 0 ng/mL (ref 0.00–0.08)

## 2012-06-26 LAB — COMPREHENSIVE METABOLIC PANEL
Alkaline Phosphatase: 139 U/L — ABNORMAL HIGH (ref 39–117)
BUN: 37 mg/dL — ABNORMAL HIGH (ref 6–23)
Chloride: 103 mEq/L (ref 96–112)
Creatinine, Ser: 1.58 mg/dL — ABNORMAL HIGH (ref 0.50–1.10)
GFR calc Af Amer: 34 mL/min — ABNORMAL LOW (ref >90)
Glucose, Bld: 310 mg/dL — ABNORMAL HIGH (ref 70–99)
Potassium: 4.8 mEq/L (ref 3.5–5.1)
Total Bilirubin: 0.2 mg/dL — ABNORMAL LOW (ref 0.3–1.2)
Total Protein: 6.9 g/dL (ref 6.0–8.3)

## 2012-06-26 LAB — CBC WITH DIFFERENTIAL/PLATELET
Eosinophils Absolute: 0.3 10*3/uL (ref 0.0–0.7)
HCT: 36.3 % (ref 36.0–46.0)
Hemoglobin: 11.4 g/dL — ABNORMAL LOW (ref 12.0–15.0)
Lymphs Abs: 2 10*3/uL (ref 0.7–4.0)
MCH: 26.1 pg (ref 26.0–34.0)
MCHC: 31.4 g/dL (ref 30.0–36.0)
Monocytes Absolute: 0.6 10*3/uL (ref 0.1–1.0)
Monocytes Relative: 7 % (ref 3–12)
Neutrophils Relative %: 66 % (ref 43–77)
RBC: 4.37 MIL/uL (ref 3.87–5.11)

## 2012-06-26 MED ORDER — DIAZEPAM 2 MG PO TABS
2.0000 mg | ORAL_TABLET | Freq: Once | ORAL | Status: AC
  Administered 2012-06-26: 2 mg via ORAL
  Filled 2012-06-26: qty 1

## 2012-06-26 MED ORDER — SODIUM CHLORIDE 0.9 % IV SOLN
INTRAVENOUS | Status: DC
  Administered 2012-06-26: 22:00:00 via INTRAVENOUS

## 2012-06-26 NOTE — ED Provider Notes (Signed)
History     CSN: [id number]  Arrival date & time 06/26/12  2137   First MD Initiated Contact with Patient 06/26/12 2159      Chief Complaint  Patient presents with  . Chest Pain    (Consider location/radiation/quality/duration/timing/severity/associated sxs/prior treatment) The history is provided by the patient.   patient here with chest pain that began today after she exerted herself and she sat down. Described as pressure and burning lasting 20 minutes going across her chest. No associated dyspnea or diaphoresis. No nausea vomiting. Called EMS and was given nitroglycerin and pain quickly subsided. She is currently chest pain-free. She does have a history of coronary artery disease  Past Medical History  Diagnosis Date  . Diabetes mellitus   . Cancer   . Coronary artery disease   . Hypertension   . Hernia   . Hypercholesterolemia   . UTI (urinary tract infection)     Past Surgical History  Procedure Date  . Colon surgery   . Cholecystectomy   . Abdominal hysterectomy     Family History  Problem Relation Age of Onset  . Heart disease Father     History  Substance Use Topics  . Smoking status: Never Smoker   . Smokeless tobacco: Not on file  . Alcohol Use: No    OB History    Grav Para Term Preterm Abortions TAB SAB Ect Mult Living                  Review of Systems  All other systems reviewed and are negative.    Allergies  Penicillins  Home Medications   Current Outpatient Rx  Name Route Sig Dispense Refill  . ALPRAZOLAM 0.25 MG PO TABS Oral Take 0.25 mg by mouth daily as needed. For anxiety    . PRO-STAT RENAL CARE HIGH FIBER PO Oral Take 1 tablet by mouth daily.    . ASPIRIN 81 MG PO CHEW Oral Chew 81 mg by mouth daily.    Marland Kitchen CARVEDILOL PHOSPHATE ER 10 MG PO CP24 Oral Take 10 mg by mouth at bedtime.    . CLOPIDOGREL BISULFATE 75 MG PO TABS Oral Take 75 mg by mouth daily.    . CYANOCOBALAMIN 1000 MCG/ML IJ SOLN Intramuscular Inject 1,000 mcg  into the muscle every 30 (thirty) days.    Marland Kitchen ESOMEPRAZOLE MAGNESIUM 40 MG PO CPDR Oral Take 40 mg by mouth daily before breakfast.    . EZETIMIBE-SIMVASTATIN 10-40 MG PO TABS Oral Take 1 tablet by mouth at bedtime.    Marland Kitchen FERROUS GLUCONATE 325 MG PO TABS Oral Take 325 mg by mouth daily with breakfast.    . FEXOFENADINE HCL 180 MG PO TABS Oral Take 180 mg by mouth daily.    Marland Kitchen PIOGLITAZONE HCL 45 MG PO TABS Oral Take 45 mg by mouth daily.      BP 150/63  Pulse 84  Temp 97.7 F (36.5 C) (Oral)  Resp 18  SpO2 99%  Physical Exam  Nursing note and vitals reviewed. Constitutional: She is oriented to person, place, and time. She appears well-developed and well-nourished.  Non-toxic appearance. No distress.  HENT:  Head: Normocephalic and atraumatic.  Eyes: Conjunctivae normal, EOM and lids are normal. Pupils are equal, round, and reactive to light.  Neck: Normal range of motion. Neck supple. No tracheal deviation present. No mass present.  Cardiovascular: Normal rate, regular rhythm and normal heart sounds.  Exam reveals no gallop.   No murmur heard. Pulmonary/Chest: Effort normal  and breath sounds normal. No stridor. No respiratory distress. She has no decreased breath sounds. She has no wheezes. She has no rhonchi. She has no rales.  Abdominal: Soft. Normal appearance and bowel sounds are normal. She exhibits no distension. There is no tenderness. There is no rebound and no CVA tenderness.  Musculoskeletal: Normal range of motion. She exhibits no edema and no tenderness.  Neurological: She is alert and oriented to person, place, and time. She has normal strength. No cranial nerve deficit or sensory deficit. GCS eye subscore is 4. GCS verbal subscore is 5. GCS motor subscore is 6.  Skin: Skin is warm and dry. No abrasion and no rash noted.  Psychiatric: She has a normal mood and affect. Her speech is normal and behavior is normal.    ED Course  Procedures (including critical care  time)   Labs Reviewed  CBC WITH DIFFERENTIAL  COMPREHENSIVE METABOLIC PANEL   No results found.   No diagnosis found.    MDM   Date: 06/26/2012  Rate: 84  Rhythm: normal sinus rhythm  QRS Axis: normal  Intervals: normal  ST/T Wave abnormalities: nonspecific ST changes  Conduction Disutrbances:nonspecific intraventricular conduction delay  Narrative Interpretation:   Old EKG Reviewed: unchanged    11:39 PM Patient to be admitted by [facility] hospitalist for evaluation of her chest pain      Toy Baker, MD 06/26/12 2339

## 2012-06-26 NOTE — ED Notes (Signed)
Pt told EMS that she began experiencing CP while walking in [place]. Pt reported that it radiated to her left arm. EMS placed 20 g to Lt Fa and gave 324 mg ASA and 0.4mg  NTG SL.

## 2012-06-26 NOTE — ED Notes (Signed)
Pt complains of bilateral leg cramps, EDP aware

## 2012-06-27 DIAGNOSIS — I517 Cardiomegaly: Secondary | ICD-10-CM

## 2012-06-27 DIAGNOSIS — R079 Chest pain, unspecified: Secondary | ICD-10-CM

## 2012-06-27 DIAGNOSIS — I2510 Atherosclerotic heart disease of native coronary artery without angina pectoris: Secondary | ICD-10-CM

## 2012-06-27 DIAGNOSIS — E119 Type 2 diabetes mellitus without complications: Secondary | ICD-10-CM

## 2012-06-27 LAB — GLUCOSE, CAPILLARY
Glucose-Capillary: 176 mg/dL — ABNORMAL HIGH (ref 70–99)
Glucose-Capillary: 265 mg/dL — ABNORMAL HIGH (ref 70–99)

## 2012-06-27 MED ORDER — SODIUM CHLORIDE 0.9 % IJ SOLN: 3.0000 mL | Freq: Two times a day (BID) | INTRAMUSCULAR | Status: DC

## 2012-06-27 MED ORDER — CARVEDILOL PHOSPHATE ER 10 MG PO CP24
10.0000 mg | ORAL_CAPSULE | Freq: Every day | ORAL | Status: DC
  Administered 2012-06-27 (×2): 10 mg via ORAL
  Filled 2012-06-27 (×3): qty 1

## 2012-06-27 MED ORDER — MORPHINE SULFATE 2 MG/ML IJ SOLN: 2.0000 mg | INTRAMUSCULAR | Status: DC | PRN

## 2012-06-27 MED ORDER — INSULIN ASPART 100 UNIT/ML ~~LOC~~ SOLN
0.0000 [IU] | Freq: Every day | SUBCUTANEOUS | Status: DC
  Administered 2012-06-27: 3 [IU] via SUBCUTANEOUS

## 2012-06-27 MED ORDER — CYANOCOBALAMIN 1000 MCG/ML IJ SOLN: 1000.0000 ug | INTRAMUSCULAR | Status: DC

## 2012-06-27 MED ORDER — GLYBURIDE 2.5 MG PO TABS
2.5000 mg | ORAL_TABLET | Freq: Every day | ORAL | Status: DC
  Administered 2012-06-27 – 2012-06-28 (×2): 2.5 mg via ORAL
  Filled 2012-06-27 (×3): qty 1

## 2012-06-27 MED ORDER — FUROSEMIDE 20 MG PO TABS
20.0000 mg | ORAL_TABLET | Freq: Every day | ORAL | Status: DC
  Administered 2012-06-27 – 2012-06-28 (×2): 20 mg via ORAL
  Filled 2012-06-27 (×2): qty 1

## 2012-06-27 MED ORDER — CLOPIDOGREL BISULFATE 75 MG PO TABS
75.0000 mg | ORAL_TABLET | Freq: Every day | ORAL | Status: DC
  Administered 2012-06-27 – 2012-06-28 (×2): 75 mg via ORAL
  Filled 2012-06-27 (×3): qty 1

## 2012-06-27 MED ORDER — SODIUM CHLORIDE 0.9 % IJ SOLN
3.0000 mL | Freq: Two times a day (BID) | INTRAMUSCULAR | Status: DC
  Administered 2012-06-27 (×3): 3 mL via INTRAVENOUS

## 2012-06-27 MED ORDER — INSULIN ASPART 100 UNIT/ML ~~LOC~~ SOLN
0.0000 [IU] | Freq: Three times a day (TID) | SUBCUTANEOUS | Status: DC
  Administered 2012-06-27: 3 [IU] via SUBCUTANEOUS

## 2012-06-27 MED ORDER — EZETIMIBE-SIMVASTATIN 10-40 MG PO TABS
1.0000 | ORAL_TABLET | Freq: Every day | ORAL | Status: DC
  Administered 2012-06-27: 1 via ORAL
  Filled 2012-06-27 (×2): qty 1

## 2012-06-27 MED ORDER — LORATADINE 10 MG PO TABS
10.0000 mg | ORAL_TABLET | Freq: Every day | ORAL | Status: DC
  Administered 2012-06-27 – 2012-06-28 (×2): 10 mg via ORAL
  Filled 2012-06-27 (×2): qty 1

## 2012-06-27 MED ORDER — DOCUSATE SODIUM 100 MG PO CAPS
100.0000 mg | ORAL_CAPSULE | Freq: Two times a day (BID) | ORAL | Status: DC
  Administered 2012-06-27 – 2012-06-28 (×3): 100 mg via ORAL
  Filled 2012-06-27 (×4): qty 1

## 2012-06-27 MED ORDER — RENA-VITE PO TABS
1.0000 | ORAL_TABLET | Freq: Every day | ORAL | Status: DC
  Administered 2012-06-27 – 2012-06-28 (×2): 1 via ORAL
  Filled 2012-06-27 (×2): qty 1

## 2012-06-27 MED ORDER — ACETAMINOPHEN 325 MG PO TABS
650.0000 mg | ORAL_TABLET | Freq: Four times a day (QID) | ORAL | Status: DC | PRN
  Administered 2012-06-27: 650 mg via ORAL
  Filled 2012-06-27: qty 2

## 2012-06-27 MED ORDER — LOSARTAN POTASSIUM 25 MG PO TABS
25.0000 mg | ORAL_TABLET | Freq: Every day | ORAL | Status: DC
  Administered 2012-06-27 – 2012-06-28 (×2): 25 mg via ORAL
  Filled 2012-06-27 (×2): qty 1

## 2012-06-27 MED ORDER — PANTOPRAZOLE SODIUM 40 MG PO TBEC
80.0000 mg | DELAYED_RELEASE_TABLET | Freq: Every day | ORAL | Status: DC
  Administered 2012-06-27 – 2012-06-28 (×2): 80 mg via ORAL
  Filled 2012-06-27: qty 2
  Filled 2012-06-27: qty 1

## 2012-06-27 MED ORDER — ALPRAZOLAM 0.25 MG PO TABS: 0.2500 mg | ORAL_TABLET | Freq: Three times a day (TID) | ORAL | Status: DC | PRN

## 2012-06-27 MED ORDER — SODIUM CHLORIDE 0.9 % IJ SOLN: 3.0000 mL | INTRAMUSCULAR | Status: DC | PRN

## 2012-06-27 MED ORDER — SODIUM CHLORIDE 0.9 % IV SOLN: 250.0000 mL | INTRAVENOUS | Status: DC | PRN

## 2012-06-27 MED ORDER — INFLUENZA VIRUS VACC SPLIT PF IM SUSP
0.5000 mL | INTRAMUSCULAR | Status: AC
  Administered 2012-06-27: 0.5 mL via INTRAMUSCULAR
  Filled 2012-06-27: qty 0.5

## 2012-06-27 MED ORDER — ASPIRIN 81 MG PO CHEW
81.0000 mg | CHEWABLE_TABLET | Freq: Every day | ORAL | Status: DC
  Administered 2012-06-27 – 2012-06-28 (×2): 81 mg via ORAL
  Filled 2012-06-27 (×2): qty 1

## 2012-06-27 MED ORDER — FERROUS SULFATE 325 (65 FE) MG PO TABS
325.0000 mg | ORAL_TABLET | Freq: Every day | ORAL | Status: DC
  Administered 2012-06-27 – 2012-06-28 (×2): 325 mg via ORAL
  Filled 2012-06-27 (×3): qty 1

## 2012-06-27 NOTE — Progress Notes (Signed)
Patient seen and evaluated earlier this am by my associate.  Please see H and P for further details.  Cardiac enzymes remain negative and echocardiogram pending at this juncture.  Will f/u with pending work up and make recommendations pending results.  Amy Chavez, [employer]

## 2012-06-27 NOTE — Progress Notes (Signed)
  Echocardiogram 2D Echocardiogram has been performed.  Georgian Co 06/27/2012, 5:46 PM

## 2012-06-27 NOTE — Progress Notes (Signed)
Please note CBG trend after administering 3 units of insulin per SSI order. CBG rechecked and is now 125 after eating dinner. Will continue to monitor. Harlow Asa

## 2012-06-27 NOTE — Care Management Note (Unsigned)
    Page 1 of 1   06/27/2012     2:39:38 PM   CARE MANAGEMENT NOTE 06/27/2012  Patient:  Amy Chavez, Amy Chavez   Account Number:  [id number]  Date Initiated:  06/27/2012  Documentation initiated by:  Gwenda Heiner  Subjective/Objective Assessment:   PT ADM ON 06/26/12 WITH CHEST PAIN.  PTA, PT INDEPENDENT, LIVES WITH DAUGHTER.     Action/Plan:   WILL FOLLOW FOR HOME NEEDS AS PT PROGRESSES.   Anticipated DC Date:  06/28/2012   Anticipated DC Plan:  HOME/SELF CARE      DC Planning Services  CM consult      Choice offered to / List presented to:             Status of service:  In process, will continue to follow Medicare Important Message given?   (If response is "NO", the following Medicare IM given date fields will be blank) Date Medicare IM given:   Date Additional Medicare IM given:    Discharge Disposition:    Per UR Regulation:  Reviewed for med. necessity/level of care/duration of stay  If discussed at Long Length of Stay Meetings, dates discussed:    Comments:

## 2012-06-27 NOTE — H&P (Signed)
[facility] History and Physical  Sakhia Stidd ZOX:[id number] DOB: 12-29-1925    PCP:   Barbette Reichmann, MD   Chief Complaint: substernal chestpain.  HPI: Amy Chavez is an 78 y.o. female with known CAD, DM,HTN, known CAD but no prior MI, s/p 4 cardiac stents last one several years ago, on Plavix, presents to the ER with one hour of substernal chest pain without associated shortness of breath, nausea or vomiting, fever, chills, or cough.  She admitted to having DOE of late, but never had exertional chest pain.  In the ER, she was pain free after she was given Valium.  Evaluation included an EKG which showed NSR and no acute ST-T changes (though V4 has peaked T waves).  Her troponin is negative and her CXR is negative.  She has normal renal fx tests, and her K is normal.  Due to her multiple risk factors, hospitalist was asked to admit her for cardiac r/out.  Rewiew of Systems:  Constitutional: Negative for malaise, fever and chills. No significant weight loss or weight gain Eyes: Negative for eye pain, redness and discharge, diplopia, visual changes, or flashes of light. ENMT: Negative for ear pain, hoarseness, nasal congestion, sinus pressure and sore throat. No headaches; tinnitus, drooling, or problem swallowing. Cardiovascular: Negative for , palpitations, diaphoresis,  and peripheral edema. ; No orthopnea, PND Respiratory: Negative for cough, hemoptysis, wheezing and stridor. No pleuritic chestpain. Gastrointestinal: Negative for nausea, vomiting, diarrhea, constipation, abdominal pain, melena, blood in stool, hematemesis, jaundice and rectal bleeding.    Genitourinary: Negative for frequency, dysuria, incontinence,flank pain and hematuria; Musculoskeletal: Negative for back pain and neck pain. Negative for swelling and trauma.;  Skin: . Negative for pruritus, rash, abrasions, bruising and skin lesion.; ulcerations Neuro: Negative for headache, lightheadedness and neck  stiffness. Negative for weakness, altered level of consciousness , altered mental status, extremity weakness, burning feet, involuntary movement, seizure and syncope.  Psych: negative for anxiety, depression, insomnia, tearfulness, panic attacks, hallucinations, paranoia, suicidal or homicidal ideation    Past Medical History  Diagnosis Date  . Diabetes mellitus   . Cancer   . Coronary artery disease   . Hypertension   . Hernia   . Hypercholesterolemia   . UTI (urinary tract infection)     Past Surgical History  Procedure Date  . Colon surgery   . Cholecystectomy   . Abdominal hysterectomy     Medications:  HOME MEDS: Prior to Admission medications   Medication Sig Start Date End Date Taking? Authorizing Provider  ALPRAZolam (XANAX) 0.25 MG tablet Take 0.25 mg by mouth daily as needed. For anxiety   Yes Historical Provider, MD  aspirin 81 MG chewable tablet Chew 81 mg by mouth daily.   Yes Historical Provider, MD  clopidogrel (PLAVIX) 75 MG tablet Take 75 mg by mouth daily.   Yes Historical Provider, MD  cyanocobalamin (,VITAMIN B-12,) 1000 MCG/ML injection Inject 1,000 mcg into the muscle every 6 (six) months.    Yes Historical Provider, MD  esomeprazole (NEXIUM) 40 MG capsule Take 40 mg by mouth daily before breakfast.   Yes Historical Provider, MD  ezetimibe-simvastatin (VYTORIN) 10-40 MG per tablet Take 1 tablet by mouth at bedtime.   Yes Historical Provider, MD  ferrous sulfate 325 (65 FE) MG tablet Take 325 mg by mouth daily with breakfast.   Yes Historical Provider, MD  fexofenadine (ALLEGRA) 180 MG tablet Take 180 mg by mouth daily as needed. For allergies   Yes Historical Provider, MD  furosemide (LASIX) 20  MG tablet Take 20 mg by mouth daily as needed. For edema   Yes Historical Provider, MD  glyBURIDE (DIABETA) 2.5 MG tablet Take 2.5 mg by mouth daily with breakfast.   Yes Historical Provider, MD  losartan (COZAAR) 25 MG tablet Take 25 mg by mouth daily.   Yes  Historical Provider, MD  multivitamin (RENA-VIT) TABS tablet Take 1 tablet by mouth daily.   Yes Historical Provider, MD     Allergies:  Allergies  Allergen Reactions  . Penicillins Nausea And Vomiting  . Tape     Skin rips    Social History:   reports that she has never smoked. She does not have any smokeless tobacco history on file. She reports that she does not drink alcohol or use illicit drugs.  Family History: Family History  Problem Relation Age of Onset  . Heart disease Father      Physical Exam: Filed Vitals:   06/26/12 2330 06/27/12 0000 06/27/12 0030 06/27/12 0100  BP: 150/47 139/57 141/47 116/51  Pulse: 81 80 75 70  Temp:      TempSrc:      Resp:      SpO2: 98% 98% 98% 98%   Blood pressure 116/51, pulse 70, temperature 98 F (36.7 C), temperature source Oral, resp. rate 16, SpO2 98.00%.  GEN:  Pleasant patient lying in the stretcher in no acute distress; cooperative with exam. PSYCH:  alert and oriented x4; does not appear anxious or depressed; affect is appropriate. HEENT: Mucous membranes pink and anicteric; PERRLA; EOM intact; no cervical lymphadenopathy nor thyromegaly or carotid bruit; no JVD; There were no stridor. Neck is very supple. Breasts:: Not examined CHEST WALL: No tenderness CHEST: Normal respiration, clear to auscultation bilaterally.  HEART: Regular rate and rhythm.  There are no murmur, rub, or gallops.   BACK: No kyphosis or scoliosis; no CVA tenderness ABDOMEN: soft and non-tender; no masses, no organomegaly, normal abdominal bowel sounds; no pannus; no intertriginous candida. There is no rebound and no distention. Rectal Exam: Not done EXTREMITIES: No bone or joint deformity; age-appropriate arthropathy of the hands and knees; no edema; no ulcerations.  There is no calf tenderness. Genitalia: not examined PULSES: 2+ and symmetric SKIN: Normal hydration no rash or ulceration CNS: Cranial nerves 2-12 grossly intact no focal lateralizing  neurologic deficit.  Speech is fluent; uvula elevated with phonation, facial symmetry and tongue midline. DTR are normal bilaterally, cerebella exam is intact, barbinski is negative and strengths are equaled bilaterally.  No sensory loss.   Labs on Admission:  Basic Metabolic Panel:  Lab 06/26/12 1610  NA 134*  K 4.8  CL 103  CO2 23  GLUCOSE 310*  BUN 37*  CREATININE 1.58*  CALCIUM 9.2  MG --  PHOS --   Liver Function Tests:  Lab 06/26/12 2139  AST 17  ALT 16  ALKPHOS 139*  BILITOT 0.2*  PROT 6.9  ALBUMIN 3.3*   No results found for this basename: LIPASE:5,AMYLASE:5 in the last 168 hours No results found for this basename: AMMONIA:5 in the last 168 hours CBC:  Lab 06/26/12 2139  WBC 8.7  NEUTROABS 5.8  HGB 11.4*  HCT 36.3  MCV 83.1  PLT 247   Cardiac Enzymes: No results found for this basename: CKTOTAL:5,CKMB:5,CKMBINDEX:5,TROPONINI:5 in the last 168 hours  CBG: No results found for this basename: GLUCAP:5 in the last 168 hours   Radiological Exams on Admission: Dg Chest 2 View  06/26/2012  *RADIOLOGY REPORT*  Clinical Data: Chest pain.  CHEST - 2 VIEW  Comparison: September 22, 2011.  Findings: Large hiatal hernia is again noted.  No acute pulmonary disease is noted.  Bony thorax is intact.  Mild cardiomegaly is noted.  IMPRESSION: Large hiatal hernia.  No acute cardiopulmonary abnormality seen.   Original Report Authenticated By: Venita Sheffield., M.D.     EKG: Independently reviewed. SR, peaked T in V4, no ischemic or injury pattern.   Assessment/Plan Present on Admission:  .Chest pain .CAD (coronary artery disease) .Diabetes mellitus, type 2 .HLD (hyperlipidemia) .HTN (hypertension)   PLAN:  Will admit her into the telemetry for cardiac r/out.  I will hold off on starting her on IV heparin, as I am not convinced she had an ACS.  She have had known CAD, and if she r/out, she would benefit getting a stress test (DOE) whether done inpatient or  outpatient.  Currently, she is pain free and stable.  Will get an ECHO to reassess any WMA.  For her other stable medical problems, I have continued her home meds.  She is a full code, and will be admitted to [facility] service.  Other plans as per orders.  Code Status: FULL Unk Lightning, MD. [facility] Pager [phone] 7pm to 7am.  06/27/2012, 1:59 AM

## 2012-06-28 DIAGNOSIS — I10 Essential (primary) hypertension: Secondary | ICD-10-CM

## 2012-06-28 DIAGNOSIS — E785 Hyperlipidemia, unspecified: Secondary | ICD-10-CM

## 2012-06-28 LAB — GLUCOSE, CAPILLARY: Glucose-Capillary: 138 mg/dL — ABNORMAL HIGH (ref 70–99)

## 2012-06-28 NOTE — Discharge Summary (Signed)
Physician Discharge Summary  Amy Chavez AVW:[id number] DOB: [birth year] DOA: 06/26/2012  PCP: Barbette Reichmann, MD  Admit date: 06/26/2012 Discharge date: 06/28/2012  Recommendations for Outpatient Follow-up:  1. Please be sure to follow up and consider stress test.  Pt had echocardiogram with no motion wall abnormalities and negative Cardiac enzymes 2. Also f/u with creatinine  Discharge Diagnoses:  Principal Problem:  *Chest pain Active Problems:  Diabetes mellitus, type 2  CAD (coronary artery disease)  HLD (hyperlipidemia)  Solitary kidney, acquired  HTN (hypertension)   Discharge Condition: Stable  Diet recommendation: Cardiac  Filed Weights   06/27/12 0215  Weight: 84.46 kg (186 lb 3.2 oz)    History of present illness:  From original HPI: Amy Chavez is an 78 y.o. female with known CAD, DM,HTN, known CAD but no prior MI, s/p 4 cardiac stents last one several years ago, on Plavix, presents to the ER with one hour of substernal chest pain without associated shortness of breath, nausea or vomiting, fever, chills, or cough. She admitted to having DOE of late, but never had exertional chest pain. In the ER, she was pain free after she was given Valium. Evaluation included an EKG which showed NSR and no acute ST-T changes (though V4 has peaked T waves). Her troponin is negative and her CXR is negative. She has normal renal fx tests, and her K is normal. Due to her multiple risk factors, hospitalist was asked to admit her for cardiac r/out.   Hospital Course:  Chest pain - Three sets of cardiac enzymes negative - Echocardiogram reviewed.  No wall motion abnormalities with normal EF of 60-65% - Resolved on day of discharge.  Felt to be musculoskeletal although patient should have follow up with cardiologist for further evaluation and recommendations.  HTN: - Stable will plan on continuing home regimen  CAD - Stable continue home regimen  DM - Discharge on home  regimen and recommend patient continue to monitor her blood sugars atleast 2 times per day.   - Metformin contraindicated due to creatinine level  GERD - stable continue nexium  Procedures:  Echocardiogram: Study Conclusions  - Left ventricle: The cavity size was normal. Wall thickness was normal. Systolic function was normal. The estimated ejection fraction was in the range of 60% to 65%. Wall motion was normal; there were no regional wall motion abnormalities. Doppler parameters are consistent with abnormal left ventricular relaxation (grade 1 diastolic dysfunction). - Left atrium: The atrium was mildly dilated.    Consultations:  None  Discharge Exam: Filed Vitals:   06/27/12 0656 06/27/12 1400 06/27/12 2102 06/28/12 0514  BP: 121/73 121/69 124/58 118/53  Pulse: 74 63 68 63  Temp: 97.4 F (36.3 C) 98.3 F (36.8 C) 98.2 F (36.8 C) 98 F (36.7 C)  TempSrc: Oral Oral Oral Oral  Resp: 20 18 18 18   Height:      Weight:      SpO2: 92% 96% 96% 97%    General: Pt in NAD, Alert and O x 3 Cardiovascular: RRR, no MRG Respiratory: CTA BL no wheezes Abdomen: soft, NT, ND  Discharge Instructions  Discharge Orders    Future Orders Please Complete By Expires   Diet - low sodium heart healthy      Increase activity slowly      Discharge instructions      Comments:   Please be sure to follow up with your cardiologist in 1-2 weeks or sooner should any new concerns arise.   Call MD for:  persistant nausea and vomiting      Call MD for:  severe uncontrolled pain      Call MD for:  persistant dizziness or light-headedness      Call MD for:  difficulty breathing, headache or visual disturbances          Medication List     As of 06/28/2012 10:22 AM    STOP taking these medications         carvedilol 10 MG 24 hr capsule   Commonly known as: COREG CR      ferrous gluconate 325 MG tablet   Commonly known as: FERGON      TAKE these medications          ALPRAZolam 0.25 MG tablet   Commonly known as: XANAX   Take 0.25 mg by mouth daily as needed. For anxiety      aspirin 81 MG chewable tablet   Chew 81 mg by mouth daily.      clopidogrel 75 MG tablet   Commonly known as: PLAVIX   Take 75 mg by mouth daily.      cyanocobalamin 1000 MCG/ML injection   Commonly known as: (VITAMIN B-12)   Inject 1,000 mcg into the muscle every 6 (six) months.      esomeprazole 40 MG capsule   Commonly known as: NEXIUM   Take 40 mg by mouth daily before breakfast.      ezetimibe-simvastatin 10-40 MG per tablet   Commonly known as: VYTORIN   Take 1 tablet by mouth at bedtime.      ferrous sulfate 325 (65 FE) MG tablet   Take 325 mg by mouth daily with breakfast.      fexofenadine 180 MG tablet   Commonly known as: ALLEGRA   Take 180 mg by mouth daily as needed. For allergies      furosemide 20 MG tablet   Commonly known as: LASIX   Take 20 mg by mouth daily as needed. For edema      glyBURIDE 2.5 MG tablet   Commonly known as: DIABETA   Take 2.5 mg by mouth daily with breakfast.      losartan 25 MG tablet   Commonly known as: COZAAR   Take 25 mg by mouth daily.      multivitamin Tabs tablet   Take 1 tablet by mouth daily.          The results of significant diagnostics from this hospitalization (including imaging, microbiology, ancillary and laboratory) are listed below for reference.    Significant Diagnostic Studies: Dg Chest 2 View  06/26/2012  *RADIOLOGY REPORT*  Clinical Data: Chest pain.  CHEST - 2 VIEW  Comparison: September 22, 2011.  Findings: Large hiatal hernia is again noted.  No acute pulmonary disease is noted.  Bony thorax is intact.  Mild cardiomegaly is noted.  IMPRESSION: Large hiatal hernia.  No acute cardiopulmonary abnormality seen.   Original Report Authenticated By: Venita Sheffield., M.D.     Microbiology: No results found for this or any previous visit (from the past 240 hour(s)).   Labs: Basic Metabolic  Panel:  Lab 06/26/12 2139  NA 134*  K 4.8  CL 103  CO2 23  GLUCOSE 310*  BUN 37*  CREATININE 1.58*  CALCIUM 9.2  MG --  PHOS --   Liver Function Tests:  Lab 06/26/12 2139  AST 17  ALT 16  ALKPHOS 139*  BILITOT 0.2*  PROT 6.9  ALBUMIN 3.3*  No results found for this basename: LIPASE:5,AMYLASE:5 in the last 168 hours No results found for this basename: AMMONIA:5 in the last 168 hours CBC:  Lab 06/26/12 2139  WBC 8.7  NEUTROABS 5.8  HGB 11.4*  HCT 36.3  MCV 83.1  PLT 247   Cardiac Enzymes:  Lab 06/27/12 1440 06/27/12 0825 06/27/12 0222  CKTOTAL -- -- --  CKMB -- -- --  CKMBINDEX -- -- --  TROPONINI <0.30 <0.30 <0.30   BNP: BNP (last 3 results)  Basename 09/23/11 0003  PROBNP 295.8   CBG:  Lab 06/28/12 0621 06/27/12 2100 06/27/12 1719 06/27/12 1647 06/27/12 1043  GLUCAP 138* 265* 125* 59* 176*    Time coordinating discharge: > 35 minutes  Signed:  Penny Pia  [facility] 06/28/2012, 10:22 AM

## 2012-07-11 ENCOUNTER — Ambulatory Visit: Payer: Self-pay | Admitting: Internal Medicine

## 2013-07-26 ENCOUNTER — Emergency Department: Payer: Self-pay

## 2013-07-26 LAB — COMPREHENSIVE METABOLIC PANEL
Albumin: 3.1 g/dL — ABNORMAL LOW (ref 3.4–5.0)
Alkaline Phosphatase: 162 U/L — ABNORMAL HIGH (ref 50–136)
Calcium, Total: 8.8 mg/dL (ref 8.5–10.1)
Creatinine: 1.67 mg/dL — ABNORMAL HIGH (ref 0.60–1.30)
EGFR (African American): 32 — ABNORMAL LOW
EGFR (Non-African Amer.): 28 — ABNORMAL LOW
Glucose: 226 mg/dL — ABNORMAL HIGH (ref 65–99)
Osmolality: 283 (ref 275–301)
SGOT(AST): 18 U/L (ref 15–37)
Total Protein: 7.4 g/dL (ref 6.4–8.2)

## 2013-07-26 LAB — URINALYSIS, COMPLETE
Bilirubin,UR: NEGATIVE
Blood: NEGATIVE
Ketone: NEGATIVE
Nitrite: NEGATIVE
Ph: 5 (ref 4.5–8.0)
RBC,UR: 25 /HPF (ref 0–5)
Specific Gravity: 1.017 (ref 1.003–1.030)

## 2013-07-26 LAB — CBC
HCT: 36.6 % (ref 35.0–47.0)
HGB: 11.7 g/dL — ABNORMAL LOW (ref 12.0–16.0)
MCV: 82 fL (ref 80–100)
RDW: 13.2 % (ref 11.5–14.5)
WBC: 8.1 10*3/uL (ref 3.6–11.0)

## 2014-01-09 ENCOUNTER — Ambulatory Visit: Payer: Self-pay | Admitting: Internal Medicine

## 2014-08-05 ENCOUNTER — Emergency Department: Payer: Self-pay | Admitting: Student

## 2014-08-12 ENCOUNTER — Ambulatory Visit: Payer: Self-pay | Admitting: Internal Medicine

## 2016-09-17 ENCOUNTER — Emergency Department
Admission: EM | Admit: 2016-09-17 | Discharge: 2016-09-17 | Disposition: A | Discharge status: Home / Self Care | Payer: Medicare Other | Attending: Emergency Medicine | Admitting: Emergency Medicine

## 2016-09-17 ENCOUNTER — Encounter: Payer: Self-pay | Admitting: Emergency Medicine

## 2016-09-17 DIAGNOSIS — T887XXA Unspecified adverse effect of drug or medicament, initial encounter: Secondary | ICD-10-CM | POA: Diagnosis present

## 2016-09-17 DIAGNOSIS — T50905A Adverse effect of unspecified drugs, medicaments and biological substances, initial encounter: Secondary | ICD-10-CM

## 2016-09-17 DIAGNOSIS — Y69 Unspecified misadventure during surgical and medical care: Secondary | ICD-10-CM | POA: Insufficient documentation

## 2016-09-17 DIAGNOSIS — E1122 Type 2 diabetes mellitus with diabetic chronic kidney disease: Secondary | ICD-10-CM | POA: Diagnosis not present

## 2016-09-17 DIAGNOSIS — T50995A Adverse effect of other drugs, medicaments and biological substances, initial encounter: Secondary | ICD-10-CM | POA: Insufficient documentation

## 2016-09-17 DIAGNOSIS — I2510 Atherosclerotic heart disease of native coronary artery without angina pectoris: Secondary | ICD-10-CM | POA: Insufficient documentation

## 2016-09-17 DIAGNOSIS — N183 Chronic kidney disease, stage 3 (moderate): Secondary | ICD-10-CM | POA: Insufficient documentation

## 2016-09-17 DIAGNOSIS — N390 Urinary tract infection, site not specified: Secondary | ICD-10-CM | POA: Insufficient documentation

## 2016-09-17 DIAGNOSIS — Z79899 Other long term (current) drug therapy: Secondary | ICD-10-CM | POA: Diagnosis not present

## 2016-09-17 DIAGNOSIS — Z85038 Personal history of other malignant neoplasm of large intestine: Secondary | ICD-10-CM | POA: Insufficient documentation

## 2016-09-17 DIAGNOSIS — I129 Hypertensive chronic kidney disease with stage 1 through stage 4 chronic kidney disease, or unspecified chronic kidney disease: Secondary | ICD-10-CM | POA: Diagnosis not present

## 2016-09-17 DIAGNOSIS — Z7982 Long term (current) use of aspirin: Secondary | ICD-10-CM | POA: Diagnosis not present

## 2016-09-17 DIAGNOSIS — Z7984 Long term (current) use of oral hypoglycemic drugs: Secondary | ICD-10-CM | POA: Diagnosis not present

## 2016-09-17 LAB — COMPREHENSIVE METABOLIC PANEL
ALBUMIN: 3.5 g/dL (ref 3.5–5.0)
ALK PHOS: 94 U/L (ref 38–126)
ALT: 26 U/L (ref 14–54)
AST: 33 U/L (ref 15–41)
Anion gap: 8 (ref 5–15)
BILIRUBIN TOTAL: 0.8 mg/dL (ref 0.3–1.2)
BUN: 26 mg/dL — ABNORMAL HIGH (ref 6–20)
CALCIUM: 9 mg/dL (ref 8.9–10.3)
CO2: 22 mmol/L (ref 22–32)
CREATININE: 1.53 mg/dL — AB (ref 0.44–1.00)
Chloride: 101 mmol/L (ref 101–111)
GFR calc non Af Amer: 29 mL/min — ABNORMAL LOW (ref >60)
GFR, EST AFRICAN AMERICAN: 34 mL/min — AB (ref >60)
GLUCOSE: 224 mg/dL — AB (ref 65–99)
Potassium: 4.7 mmol/L (ref 3.5–5.1)
Sodium: 131 mmol/L — ABNORMAL LOW (ref 135–145)
TOTAL PROTEIN: 7.4 g/dL (ref 6.5–8.1)

## 2016-09-17 LAB — URINALYSIS, COMPLETE (UACMP) WITH MICROSCOPIC
BILIRUBIN URINE: NEGATIVE
Glucose, UA: NEGATIVE mg/dL
Ketones, ur: 5 mg/dL — AB
NITRITE: NEGATIVE
PH: 5 (ref 5.0–8.0)
PROTEIN: 100 mg/dL — AB
Specific Gravity, Urine: 1.014 (ref 1.005–1.030)

## 2016-09-17 LAB — CBC WITH DIFFERENTIAL/PLATELET
Basophils Absolute: 0 10*3/uL (ref 0–0.1)
Basophils Relative: 0 %
Eosinophils Absolute: 0 10*3/uL (ref 0–0.7)
Eosinophils Relative: 0 %
HEMATOCRIT: 35.5 % (ref 35.0–47.0)
HEMOGLOBIN: 11.4 g/dL — AB (ref 12.0–16.0)
LYMPHS ABS: 0.3 10*3/uL — AB (ref 1.0–3.6)
Lymphocytes Relative: 3 %
MCH: 26.8 pg (ref 26.0–34.0)
MCHC: 32.1 g/dL (ref 32.0–36.0)
MCV: 83.5 fL (ref 80.0–100.0)
MONOS PCT: 5 %
Monocytes Absolute: 0.5 10*3/uL (ref 0.2–0.9)
NEUTROS ABS: 9.6 10*3/uL — AB (ref 1.4–6.5)
Neutrophils Relative %: 92 %
Platelets: 201 10*3/uL (ref 150–440)
RBC: 4.25 MIL/uL (ref 3.80–5.20)
RDW: 13.3 % (ref 11.5–14.5)
WBC: 10.4 10*3/uL (ref 3.6–11.0)

## 2016-09-17 LAB — LACTIC ACID, PLASMA: Lactic Acid, Venous: 1 mmol/L (ref 0.5–1.9)

## 2016-09-17 LAB — LIPASE, BLOOD: Lipase: 12 U/L (ref 11–51)

## 2016-09-17 MED ORDER — FOSFOMYCIN TROMETHAMINE 3 G PO PACK
3.0000 g | PACK | ORAL | Status: AC
  Administered 2016-09-17: 3 g via ORAL
  Filled 2016-09-17: qty 3

## 2016-09-17 MED ORDER — SODIUM CHLORIDE 0.9 % IV BOLUS (SEPSIS)
500.0000 mL | Freq: Once | INTRAVENOUS | Status: AC
  Administered 2016-09-17: 500 mL via INTRAVENOUS

## 2016-09-17 MED ORDER — FOSFOMYCIN TROMETHAMINE 3 G PO PACK: 3.0000 g | PACK | Freq: Once | ORAL | 0 refills | Status: AC

## 2016-09-17 NOTE — ED Notes (Signed)
This RN to bedside to explain and apologize for delay. Pt and her daughter states understanding at this time. Will continue to monitor for further patient needs. Pt visualized in NAD, respirations even and unlabored.

## 2016-09-17 NOTE — ED Notes (Signed)
Pt resting in bed at this time with daughter at bedside. Dr. Jacqualine Code at bedside at this time.

## 2016-09-17 NOTE — Discharge Instructions (Signed)
You have been seen in the Emergency Department (ED) today for pain when urinating.  Your workup today suggests that you have a urinary tract infection (UTI). I am not sure if the reaction he had today was necessary from antibiotic, but I would recommend discontinuation of your antibiotic's "Ceftin". We will change her to another antibiotic, fosfomycin which she should take a second dose of on Tuesday  Call your regular doctor to schedule the next available appointment to follow up on today?s ED visit, or return immediately to the ED if your pain worsens, you have decreased urine production, develop fever, persistent vomiting, or other symptoms that concern you.

## 2016-09-17 NOTE — ED Triage Notes (Signed)
Pt presents to ED with c/o possible medication reaction. Pt states took first dose of Ceftin at approx 1400, pt reports she then started tremoring. Pt denies hives, SHOB, swelling, or redness. Pt is alert and oriented at this time, respirations even and unlabored.

## 2016-09-17 NOTE — ED Notes (Signed)
Pt states has had fevers with her UTI, reports last took Tylenol at approx 1430.

## 2016-09-17 NOTE — ED Notes (Signed)
NAD noted at time of D/C. Pt D/C into the care of her daughter. Pt taken to lobby via wheelchair at this time. Pt denies comments/concerns at this time.

## 2016-09-17 NOTE — ED Notes (Signed)
This RN to bedside at this time. Pt received approx 300cc of her bolus, this RN consulted with Dr. Jacqualine Code, per Dr. Jacqualine Code, allow patient to finish bolus then patient is okay for D/C. RN explained this plan of care to patient, patient states understanding at this time.

## 2016-09-17 NOTE — ED Notes (Signed)
Pt resting in bed at this time. NAD Noted. Pt's daughter at bedside. Pt given 500cc bolus of fluids and started on new abx. Pt tolerated well. Pt denies any further needs at this time. Will continue to monitor for further patient needs at this time.

## 2016-09-17 NOTE — ED Notes (Signed)
This RN to bedside at this time. This RN assisted patient to the bathroom. Pt stated that she "felt really weak" while walking back from the bathroom. This RN obtained urine specimen at this time. Will continue to monitor for further patient needs.

## 2016-09-17 NOTE — ED Provider Notes (Signed)
[facility] Emergency Department Provider Note   ____________________________________________   First MD Initiated Contact with Patient 09/17/16 1535     (approximate)  I have reviewed the triage vital signs and the nursing notes.   HISTORY  Chief Complaint Tremors    HPI Amy Chavez is a 83 y.o. female for evaluation of a shaking episode. Patient reports that she took a new antibiotic, Ceftin, and within about 30 minutes she experiencing a shaking feeling in her hands and legs. It has gone away now. She reports that she saw her primary care doctor, and was diagnosed with "another urinary infection" for which antibiotics started. She has not had any nausea or vomiting, she did not know she had a fever. She's been feeling her normal state of health otherwise, reports frequent urinary infections.  She does have a history of kidney disease, and reports she has chronic weak kidney. She has frequent urinary tract infections. No back pain and neck pain headache nausea vomiting or other concern.   Past Medical History:  Diagnosis Date  . Cancer ([place])   . Coronary artery disease   . Diabetes mellitus   . Hernia   . Hypercholesterolemia   . Hypertension   . UTI (urinary tract infection)     Patient Active Problem List   Diagnosis Date Noted  . Chest pain 09/22/2011  . Diabetes mellitus, type 2 ([place]) 09/22/2011  . CAD (coronary artery disease) 09/22/2011  . HLD (hyperlipidemia) 09/22/2011  . CKD (chronic kidney disease), stage III 09/22/2011  . Solitary kidney, acquired 09/22/2011  . History of colon cancer 09/22/2011  . HTN (hypertension) 09/22/2011    Past Surgical History:  Procedure Laterality Date  . ABDOMINAL HYSTERECTOMY    . CHOLECYSTECTOMY    . COLON SURGERY      Prior to Admission medications   Medication Sig Start Date End Date Taking? Authorizing Provider  ALPRAZolam (XANAX) 0.25 MG tablet Take 0.25 mg by mouth daily as needed.  For anxiety    Historical Provider, MD  aspirin 81 MG chewable tablet Chew 81 mg by mouth daily.    Historical Provider, MD  clopidogrel (PLAVIX) 75 MG tablet Take 75 mg by mouth daily.    Historical Provider, MD  cyanocobalamin (,VITAMIN B-12,) 1000 MCG/ML injection Inject 1,000 mcg into the muscle every 6 (six) months.     Historical Provider, MD  esomeprazole (NEXIUM) 40 MG capsule Take 40 mg by mouth daily before breakfast.    Historical Provider, MD  ezetimibe-simvastatin (VYTORIN) 10-40 MG per tablet Take 1 tablet by mouth at bedtime.    Historical Provider, MD  ferrous sulfate 325 (65 FE) MG tablet Take 325 mg by mouth daily with breakfast.    Historical Provider, MD  fexofenadine (ALLEGRA) 180 MG tablet Take 180 mg by mouth daily as needed. For allergies    Historical Provider, MD  furosemide (LASIX) 20 MG tablet Take 20 mg by mouth daily as needed. For edema    Historical Provider, MD  glyBURIDE (DIABETA) 2.5 MG tablet Take 2.5 mg by mouth daily with breakfast.    Historical Provider, MD  losartan (COZAAR) 25 MG tablet Take 25 mg by mouth daily.    Historical Provider, MD  multivitamin (RENA-VIT) TABS tablet Take 1 tablet by mouth daily.    Historical Provider, MD    Allergies Penicillins and Tape  Family History  Problem Relation Age of Onset  . Heart disease Father     Social History Social  History  Substance Use Topics  . Smoking status: Never Smoker  . Smokeless tobacco: Not on file  . Alcohol use No    Review of Systems Constitutional: No fever/chillsThat she is aware of Eyes: No visual changes. ENT: No sore throat. Cardiovascular: Denies chest pain. Respiratory: Denies shortness of breath. Gastrointestinal: No abdominal pain.  No nausea, no vomiting.  No diarrhea.  No constipation. Genitourinary: Negative for dysuria. She does however have frequent urinary tract infections, and her primary checked her urine and she had a "UTI" Musculoskeletal: Negative for back  pain. Skin: Negative for rash. Neurological: Negative for headaches, focal weakness or numbness. He had 30 or so minutes of a tremulousness in her hands and feet that this is gone away now. 10-point ROS otherwise negative.  ____________________________________________   PHYSICAL EXAM:  VITAL SIGNS: ED Triage Vitals  Enc Vitals Group     BP 09/17/16 1520 (!) 152/71     Pulse Rate 09/17/16 1520 100     Resp 09/17/16 1520 20     Temp 09/17/16 1520 (!) 101.5 F (38.6 C)     Temp Source 09/17/16 1520 Oral     SpO2 09/17/16 1518 94 %     Weight 09/17/16 1521 171 lb (77.6 kg)     Height 09/17/16 1521 5\' 8"  (1.727 m)     Head Circumference --      Peak Flow --      Pain Score --      Pain Loc --      Pain Edu? --      Excl. in [place]? --     Constitutional: Alert and oriented. Well appearing and in no acute distress. Eyes: Conjunctivae are normal. PERRL. EOMI. Head: Atraumatic. Nose: No congestion/rhinnorhea. Mouth/Throat: Mucous membranes are moist.  Oropharynx non-erythematous. Neck: No stridor.   Cardiovascular: Normal rate, regular rhythm. Grossly normal heart sounds.  Good peripheral circulation. Respiratory: Normal respiratory effort.  No retractions. Lungs CTAB. Gastrointestinal: Soft and nontender. No CVA tenderness. Musculoskeletal: No lower extremity tenderness nor edema.  No joint effusions. Neurologic:  Normal speech and language. No gross focal neurologic deficits are appreciated. No gait instability. Skin:  Skin is warm, dry and intact. No rash noted. Psychiatric: Mood and affect are normal. Speech and behavior are normal.  ____________________________________________   LABS (all labs ordered are listed, but only abnormal results are displayed)  Labs Reviewed  CBC WITH DIFFERENTIAL/PLATELET - Abnormal; Notable for the following:       Result Value   Hemoglobin 11.4 (*)    Neutro Abs 9.6 (*)    Lymphs Abs 0.3 (*)    All other components within normal limits    COMPREHENSIVE METABOLIC PANEL - Abnormal; Notable for the following:    Sodium 131 (*)    Glucose, Bld 224 (*)    BUN 26 (*)    Creatinine, Ser 1.53 (*)    GFR calc non Af Amer 29 (*)    GFR calc Af Amer 34 (*)    All other components within normal limits  URINALYSIS, COMPLETE (UACMP) WITH MICROSCOPIC - Abnormal; Notable for the following:    Color, Urine YELLOW (*)    APPearance TURBID (*)    Hgb urine dipstick SMALL (*)    Ketones, ur 5 (*)    Protein, ur 100 (*)    Leukocytes, UA MODERATE (*)    Bacteria, UA RARE (*)    Squamous Epithelial / LPF 6-30 (*)    All other components within  normal limits  URINE CULTURE  CULTURE, BLOOD (ROUTINE X 2)  CULTURE, BLOOD (ROUTINE X 2)  LACTIC ACID, PLASMA  LIPASE, BLOOD   ____________________________________________  EKG   ____________________________________________  RADIOLOGY   ____________________________________________   PROCEDURES  Procedure(s) performed: None  Procedures  Critical Care performed: No  ____________________________________________   INITIAL IMPRESSION / ASSESSMENT AND PLAN / ED COURSE  Pertinent labs & imaging results that were available during my care of the patient were reviewed by me and considered in my medical decision making (see chart for details).  Patient presents for episode of shaking that occurred after taking a new antibiotic prescribed. She otherwise seems to be asymptomatic, but does have a notable fever which she was not aware of. I question her she may have had chills or potentially a reaction to new antibiotic. We'll check labs here, she has a fever also check lactic acid and blood cultures/urine culture.  She denies any neurologic symptoms are ongoing, has a normal and reassuring exam, no chest pain shortness rhythm without pain or other signs or symptoms suggestive has other acute illness.  ----------------------------------------- 7:35 PM on  09/17/2016 -----------------------------------------  Patient feels good. Normal hemodynamics including blood pressure and heart rate of 90 at present. Discussed with the patient and her labs and also with her daughter, she reports she would not even come to the ER note she had an infection has not seen her doctor, and the shaking episode that she had has completely resolved. I think it is reasonable to start her on fosfomycin, obtain urine culture, discussed re-dosing fosfomycin on Tuesday, and close follow-up with her primary. Patient family are agreeable.  Clinical Course      ____________________________________________   FINAL CLINICAL IMPRESSION(S) / ED DIAGNOSES  Final diagnoses:  Adverse effect of drug, initial encounter  Urinary tract infection, acute      NEW MEDICATIONS STARTED DURING THIS VISIT:  Discharge Medication List as of 09/17/2016  8:23 PM    START taking these medications   Details  fosfomycin (MONUROL) 3 g PACK Take 3 g by mouth once. Please mix in 8 oz of water, take by mouth once, Starting Sat 09/17/2016, Print         Note:  This document was prepared using Dragon voice recognition software and may include unintentional dictation errors.     Delman Kitten, MD 09/18/16 [phone]

## 2016-09-19 LAB — URINE CULTURE: Special Requests: NORMAL

## 2016-09-21 ENCOUNTER — Emergency Department
Admission: EM | Admit: 2016-09-21 | Discharge: 2016-09-21 | Disposition: A | Discharge status: Home / Self Care | Payer: Medicare Other | Attending: Student in an Organized Health Care Education/Training Program | Admitting: Student in an Organized Health Care Education/Training Program

## 2016-09-21 ENCOUNTER — Encounter: Payer: Self-pay | Admitting: Emergency Medicine

## 2016-09-21 DIAGNOSIS — Z7984 Long term (current) use of oral hypoglycemic drugs: Secondary | ICD-10-CM | POA: Diagnosis not present

## 2016-09-21 DIAGNOSIS — E119 Type 2 diabetes mellitus without complications: Secondary | ICD-10-CM | POA: Insufficient documentation

## 2016-09-21 DIAGNOSIS — N183 Chronic kidney disease, stage 3 (moderate): Secondary | ICD-10-CM | POA: Diagnosis not present

## 2016-09-21 DIAGNOSIS — I129 Hypertensive chronic kidney disease with stage 1 through stage 4 chronic kidney disease, or unspecified chronic kidney disease: Secondary | ICD-10-CM | POA: Diagnosis not present

## 2016-09-21 DIAGNOSIS — I2510 Atherosclerotic heart disease of native coronary artery without angina pectoris: Secondary | ICD-10-CM | POA: Diagnosis not present

## 2016-09-21 DIAGNOSIS — E860 Dehydration: Secondary | ICD-10-CM | POA: Diagnosis present

## 2016-09-21 LAB — COMPREHENSIVE METABOLIC PANEL
ALBUMIN: 3.9 g/dL (ref 3.5–5.0)
ALT: 28 U/L (ref 14–54)
AST: 33 U/L (ref 15–41)
Alkaline Phosphatase: 100 U/L (ref 38–126)
Anion gap: 8 (ref 5–15)
BUN: 39 mg/dL — AB (ref 6–20)
CHLORIDE: 109 mmol/L (ref 101–111)
CO2: 21 mmol/L — ABNORMAL LOW (ref 22–32)
Calcium: 9.4 mg/dL (ref 8.9–10.3)
Creatinine, Ser: 1.92 mg/dL — ABNORMAL HIGH (ref 0.44–1.00)
GFR calc Af Amer: 26 mL/min — ABNORMAL LOW (ref >60)
GFR, EST NON AFRICAN AMERICAN: 22 mL/min — AB (ref >60)
GLUCOSE: 144 mg/dL — AB (ref 65–99)
POTASSIUM: 4.7 mmol/L (ref 3.5–5.1)
Sodium: 138 mmol/L (ref 135–145)
Total Bilirubin: 0.6 mg/dL (ref 0.3–1.2)
Total Protein: 7.8 g/dL (ref 6.5–8.1)

## 2016-09-21 LAB — URINALYSIS, COMPLETE (UACMP) WITH MICROSCOPIC
BILIRUBIN URINE: NEGATIVE
Glucose, UA: NEGATIVE mg/dL
HGB URINE DIPSTICK: NEGATIVE
Ketones, ur: NEGATIVE mg/dL
Nitrite: NEGATIVE
PROTEIN: NEGATIVE mg/dL
Specific Gravity, Urine: 1.013 (ref 1.005–1.030)
pH: 5 (ref 5.0–8.0)

## 2016-09-21 LAB — CBC
HEMATOCRIT: 34.8 % — AB (ref 35.0–47.0)
Hemoglobin: 11.3 g/dL — ABNORMAL LOW (ref 12.0–16.0)
MCH: 27.1 pg (ref 26.0–34.0)
MCHC: 32.5 g/dL (ref 32.0–36.0)
MCV: 83.2 fL (ref 80.0–100.0)
Platelets: 285 10*3/uL (ref 150–440)
RBC: 4.19 MIL/uL (ref 3.80–5.20)
RDW: 13.7 % (ref 11.5–14.5)
WBC: 6.2 10*3/uL (ref 3.6–11.0)

## 2016-09-21 MED ORDER — SODIUM CHLORIDE 0.9 % IV BOLUS (SEPSIS)
1000.0000 mL | Freq: Once | INTRAVENOUS | Status: AC
  Administered 2016-09-21: 1000 mL via INTRAVENOUS

## 2016-09-21 NOTE — ED Provider Notes (Signed)
[facility] Emergency Department Provider Note    First MD Initiated Contact with Patient 09/21/16 1140     (approximate)  I have reviewed the triage vital signs and the nursing notes.   HISTORY  Chief Complaint Dehydration    HPI Amy Chavez is a 83 y.o. female presents at the request of her primary care physician due to concern for dehydration. Patient states that she was recently treated for urinary tract infection on Saturday. States that she's been taking medications every other day. Went for follow-up with Dr. Ginette Pitman today which mother told that she was still dehydrated and needed come to the ER for IV fluids. She states that her urinary tract symptoms have resolved. She denies any increased frequency. No dysuria. No flank pain. Denies any shortness of breath. No fevers. States that she feels well and would like something to drink.   Past Medical History:  Diagnosis Date  . Cancer ([place])   . Coronary artery disease   . Diabetes mellitus   . Hernia   . Hypercholesterolemia   . Hypertension   . UTI (urinary tract infection)    Family History  Problem Relation Age of Onset  . Heart disease Father    Past Surgical History:  Procedure Laterality Date  . ABDOMINAL HYSTERECTOMY    . CHOLECYSTECTOMY    . COLON SURGERY     Patient Active Problem List   Diagnosis Date Noted  . Chest pain 09/22/2011  . Diabetes mellitus, type 2 ([place]) 09/22/2011  . CAD (coronary artery disease) 09/22/2011  . HLD (hyperlipidemia) 09/22/2011  . CKD (chronic kidney disease), stage III 09/22/2011  . Solitary kidney, acquired 09/22/2011  . History of colon cancer 09/22/2011  . HTN (hypertension) 09/22/2011      Prior to Admission medications   Medication Sig Start Date End Date Taking? Authorizing Provider  ALPRAZolam (XANAX) 0.25 MG tablet Take 0.25 mg by mouth daily as needed. For anxiety    Historical Provider, MD  aspirin 81 MG chewable tablet Chew 81 mg  by mouth daily.    Historical Provider, MD  clopidogrel (PLAVIX) 75 MG tablet Take 75 mg by mouth daily.    Historical Provider, MD  cyanocobalamin (,VITAMIN B-12,) 1000 MCG/ML injection Inject 1,000 mcg into the muscle every 6 (six) months.     Historical Provider, MD  esomeprazole (NEXIUM) 40 MG capsule Take 40 mg by mouth daily before breakfast.    Historical Provider, MD  ezetimibe-simvastatin (VYTORIN) 10-40 MG per tablet Take 1 tablet by mouth at bedtime.    Historical Provider, MD  ferrous sulfate 325 (65 FE) MG tablet Take 325 mg by mouth daily with breakfast.    Historical Provider, MD  fexofenadine (ALLEGRA) 180 MG tablet Take 180 mg by mouth daily as needed. For allergies    Historical Provider, MD  furosemide (LASIX) 20 MG tablet Take 20 mg by mouth daily as needed. For edema    Historical Provider, MD  glyBURIDE (DIABETA) 2.5 MG tablet Take 2.5 mg by mouth daily with breakfast.    Historical Provider, MD  losartan (COZAAR) 25 MG tablet Take 25 mg by mouth daily.    Historical Provider, MD  multivitamin (RENA-VIT) TABS tablet Take 1 tablet by mouth daily.    Historical Provider, MD    Allergies Penicillins and Tape    Social History Social History  Substance Use Topics  . Smoking status: Never Smoker  . Smokeless tobacco: Never Used  . Alcohol use No  Review of Systems Patient denies headaches, rhinorrhea, blurry vision, numbness, shortness of breath, chest pain, edema, cough, abdominal pain, nausea, vomiting, diarrhea, dysuria, fevers, rashes or hallucinations unless otherwise stated above in HPI. ____________________________________________   PHYSICAL EXAM:  VITAL SIGNS: Vitals:   09/21/16 1036 09/21/16 1310  BP: 136/73 (!) 178/66  Pulse: 74 76  Resp: 16 16  Temp: 98 F (36.7 C)     Constitutional: Alert and oriented. Well appearing and in no acute distress. Eyes: Conjunctivae are normal. PERRL. EOMI. Head: Atraumatic. Nose: No  congestion/rhinnorhea. Mouth/Throat: Mucous membranes are moist.  Oropharynx non-erythematous. Neck: No stridor. Painless ROM. No cervical spine tenderness to palpation Hematological/Lymphatic/Immunilogical: No cervical lymphadenopathy. Cardiovascular: Normal rate, regular rhythm. Grossly normal heart sounds.  Good peripheral circulation. Respiratory: Normal respiratory effort.  No retractions. Lungs CTAB. Gastrointestinal: Soft and nontender. No distention. No abdominal bruits. No CVA tenderness. Genitourinary:  Musculoskeletal: No lower extremity tenderness nor edema.  No joint effusions. Neurologic:  Normal speech and language. No gross focal neurologic deficits are appreciated. No gait instability. Skin:  Skin is warm, dry and intact. No rash noted. Psychiatric: Mood and affect are normal. Speech and behavior are normal.  ____________________________________________   LABS (all labs ordered are listed, but only abnormal results are displayed)  Results for orders placed or performed during the hospital encounter of 09/21/16 (from the past 24 hour(s))  Comprehensive metabolic panel     Status: Abnormal   Collection Time: 09/21/16 10:55 AM  Result Value Ref Range   Sodium 138 135 - 145 mmol/L   Potassium 4.7 3.5 - 5.1 mmol/L   Chloride 109 101 - 111 mmol/L   CO2 21 (L) 22 - 32 mmol/L   Glucose, Bld 144 (H) 65 - 99 mg/dL   BUN 39 (H) 6 - 20 mg/dL   Creatinine, Ser 1.92 (H) 0.44 - 1.00 mg/dL   Calcium 9.4 8.9 - 10.3 mg/dL   Total Protein 7.8 6.5 - 8.1 g/dL   Albumin 3.9 3.5 - 5.0 g/dL   AST 33 15 - 41 U/L   ALT 28 14 - 54 U/L   Alkaline Phosphatase 100 38 - 126 U/L   Total Bilirubin 0.6 0.3 - 1.2 mg/dL   GFR calc non Af Amer 22 (L) >60 mL/min   GFR calc Af Amer 26 (L) >60 mL/min   Anion gap 8 5 - 15  CBC     Status: Abnormal   Collection Time: 09/21/16 10:55 AM  Result Value Ref Range   WBC 6.2 3.6 - 11.0 K/uL   RBC 4.19 3.80 - 5.20 MIL/uL   Hemoglobin 11.3 (L) 12.0 - 16.0  g/dL   HCT 34.8 (L) 35.0 - 47.0 %   MCV 83.2 80.0 - 100.0 fL   MCH 27.1 26.0 - 34.0 pg   MCHC 32.5 32.0 - 36.0 g/dL   RDW 13.7 11.5 - 14.5 %   Platelets 285 150 - 440 K/uL  Urinalysis, Complete w Microscopic     Status: Abnormal   Collection Time: 09/21/16 10:55 AM  Result Value Ref Range   Color, Urine YELLOW (A) YELLOW   APPearance CLOUDY (A) CLEAR   Specific Gravity, Urine 1.013 1.005 - 1.030   pH 5.0 5.0 - 8.0   Glucose, UA NEGATIVE NEGATIVE mg/dL   Hgb urine dipstick NEGATIVE NEGATIVE   Bilirubin Urine NEGATIVE NEGATIVE   Ketones, ur NEGATIVE NEGATIVE mg/dL   Protein, ur NEGATIVE NEGATIVE mg/dL   Nitrite NEGATIVE NEGATIVE   Leukocytes, UA LARGE (A)  NEGATIVE   RBC / HPF 6-30 0 - 5 RBC/hpf   WBC, UA TOO NUMEROUS TO COUNT 0 - 5 WBC/hpf   Bacteria, UA RARE (A) NONE SEEN   Squamous Epithelial / LPF 6-30 (A) NONE SEEN   WBC Clumps PRESENT    Mucous PRESENT    ____________________________________________ ____________________________________________  RADIOLOGY   ____________________________________________   PROCEDURES  Procedure(s) performed:  Procedures    Critical Care performed: no ____________________________________________   INITIAL IMPRESSION / ASSESSMENT AND PLAN / ED COURSE  Pertinent labs & imaging results that were available during my care of the patient were reviewed by me and considered in my medical decision making (see chart for details).  DDX: dehydration, uti, sepsis, nausea  LADAJA GLEICH is a 83 y.o. who presents to the ED with complaint of dehydration. Has a mild AK I but appears well-perfused. States her symptoms from her UTI have resolved and urinalysis does not appear to be worsened. Will continue current plan of treatment. She does not have any Sirs criteria and is not septic. Patient has received a bolus of IV fluids and is currently taking oral hydration. I do feel patient is stable for further outpatient follow-up.  Patient was able  to tolerate PO and was able to ambulate with a steady gait. Have discussed with the patient and available family all diagnostics and treatments performed thus far and all questions were answered to the best of my ability. The patient demonstrates understanding and agreement with plan.   Clinical Course      ____________________________________________   FINAL CLINICAL IMPRESSION(S) / ED DIAGNOSES  Final diagnoses:  Dehydration      NEW MEDICATIONS STARTED DURING THIS VISIT:  New Prescriptions   No medications on file     Note:  This document was prepared using Dragon voice recognition software and may include unintentional dictation errors.    Merlyn Lot, MD 09/21/16 [phone]

## 2016-09-21 NOTE — ED Notes (Signed)
ED Provider at bedside. 

## 2016-09-21 NOTE — ED Triage Notes (Signed)
Sent by dr Ginette Pitman for possible dehydration.  Was dx with sepsis few days ago, but they did not admit her.  Took fosfomycin.  Feels dehydrated again.  No fever.

## 2016-09-21 NOTE — ED Notes (Signed)
Patient in [place], daughter at side.  Assisted to BR.

## 2016-09-22 LAB — CULTURE, BLOOD (ROUTINE X 2)
CULTURE: NO GROWTH
Culture: NO GROWTH

## 2016-10-10 ENCOUNTER — Encounter: Payer: Self-pay | Admitting: Urology

## 2016-10-10 ENCOUNTER — Ambulatory Visit (INDEPENDENT_AMBULATORY_CARE_PROVIDER_SITE_OTHER): Payer: Medicare Other | Admitting: Urology

## 2016-10-10 VITALS — BP 164/92 | HR 76 | Ht 67.0 in | Wt 172.2 lb

## 2016-10-10 DIAGNOSIS — R351 Nocturia: Secondary | ICD-10-CM | POA: Diagnosis not present

## 2016-10-10 DIAGNOSIS — N952 Postmenopausal atrophic vaginitis: Secondary | ICD-10-CM

## 2016-10-10 DIAGNOSIS — Q600 Renal agenesis, unilateral: Secondary | ICD-10-CM | POA: Diagnosis not present

## 2016-10-10 DIAGNOSIS — N390 Urinary tract infection, site not specified: Secondary | ICD-10-CM

## 2016-10-10 DIAGNOSIS — IMO0002 Reserved for concepts with insufficient information to code with codable children: Secondary | ICD-10-CM

## 2016-10-10 DIAGNOSIS — N3946 Mixed incontinence: Secondary | ICD-10-CM | POA: Diagnosis not present

## 2016-10-10 DIAGNOSIS — R350 Frequency of micturition: Secondary | ICD-10-CM | POA: Diagnosis not present

## 2016-10-10 DIAGNOSIS — R3129 Other microscopic hematuria: Secondary | ICD-10-CM

## 2016-10-10 DIAGNOSIS — N8111 Cystocele, midline: Secondary | ICD-10-CM

## 2016-10-10 LAB — BLADDER SCAN AMB NON-IMAGING: Scan Result: 243

## 2016-10-10 NOTE — Progress Notes (Signed)
10/10/2016 10:44 AM   [name] Feb 19, 1926 [id number]  Referring provider: Tracie Harrier, MD [address] [facility] [place], [place] [address]  Chief Complaint  Patient presents with  . New Patient (Initial Visit)    Recurrent uti referred by Dr. Ginette Pitman    HPI: Patient is a 83 -year-old Caucasian female who is referred to [place] by, Dr. Ginette Pitman, for recurrent urinary tract infections who presents with her daughter, Lillette Boxer.    Patient states that she has had every time she went to see Dr Ginette Pitman she had an infection, over ten in the last year.    She states that she has infections when she becomes dehydrated and she doesn't feel right and the urine has a strong odor.  She endorses back pain   She denies dysuria, gross hematuria, suprapubic pain, abdominal pain or flank pain.   She has not had any recent fevers, chills, nausea or vomiting.   She does not have a history of nephrolithiasis or GU trauma.   Reviewing her records,  she has had one positive urine culture positive for aerococcus urinae .    She is not sexually active.  She is post menopausal.   She admits to diarrhea.  She does not engage in good perineal hygiene.  She does not take tub baths.   She does have incontinence.  She is using incontinence pads.   She is not having pain with bladder filling.    She has not had any recent imaging studies in her chart, but she states that she has been told that one of her kidneys is shriveled up.   She is drinking three 16 ounces of water daily.  One cup of coffee in the am.  No sodas.  She is takeing cranberry pills for the last year.    She is having baseline urinary symptoms of frequency (every two to three hours), nocturia (every hour), incontinence and intermittency.  Her PVR was 243 mL.    Her daughter states that her home health nurse took an urine sample recently and the results are still pending at the time of this visit.    An UA in 09/2015 was  positive for [facility] and culture was negative.    PMH: Past Medical History:  Diagnosis Date  . Anxiety   . Cancer ([place])   . Coronary artery disease   . Diabetes mellitus   . Glaucoma   . Heartburn   . Hernia   . Hypercholesterolemia   . Hypertension   . Kidney failure   . UTI (urinary tract infection)     Surgical History: Past Surgical History:  Procedure Laterality Date  . ABDOMINAL HYSTERECTOMY    . CATARACT EXTRACTION, BILATERAL Bilateral    2008  . CHOLECYSTECTOMY    . COLON SURGERY    . TOE SURGERY Right    1998    Home Medications:  Allergies as of 10/10/2016      Reactions   Cephalexin Other (See Comments)   Low back pain   Ciprofloxacin Other (See Comments)   Other Other (See Comments)   Skin rips   Penicillins Nausea And Vomiting   Sulfa Antibiotics Other (See Comments)   Sleeplessness, joint pain, cramps   Tape    Skin rips      Medication List       Accurate as of 10/10/16 10:44 AM. Always use your most recent med list.  ALPRAZolam 0.25 MG tablet Commonly known as:  XANAX Take 0.25 mg by mouth daily as needed. For anxiety   aspirin 81 MG chewable tablet Chew 81 mg by mouth daily.   clopidogrel 75 MG tablet Commonly known as:  PLAVIX Take 75 mg by mouth daily.   Cranberry 500 MG Caps Take by mouth.   cyanocobalamin 1000 MCG/ML injection Commonly known as:  (VITAMIN B-12) Inject 1,000 mcg into the muscle every 6 (six) months.   esomeprazole 40 MG capsule Commonly known as:  NEXIUM Take 40 mg by mouth daily before breakfast.   ezetimibe-simvastatin 10-40 MG tablet Commonly known as:  VYTORIN Take 1 tablet by mouth at bedtime.   ferrous sulfate 325 (65 FE) MG tablet Take 325 mg by mouth daily with breakfast.   fexofenadine 180 MG tablet Commonly known as:  ALLEGRA Take 180 mg by mouth daily as needed. For allergies   FIFTY50 GLUCOSE METER 2.0 w/Device Kit Use to test blood sugars once daily.  Dx code: E11.9     furosemide 20 MG tablet Commonly known as:  LASIX Take 20 mg by mouth daily as needed. For edema   glyBURIDE 2.5 MG tablet Commonly known as:  DIABETA Take 5 mg by mouth daily with breakfast.   losartan 25 MG tablet Commonly known as:  COZAAR Take 25 mg by mouth daily.   multivitamin Tabs tablet Take 1 tablet by mouth daily.   ranitidine 300 MG capsule Commonly known as:  ZANTAC Take by mouth.       Allergies:  Allergies  Allergen Reactions  . Cephalexin Other (See Comments)    Low back pain  . Ciprofloxacin Other (See Comments)  . Other Other (See Comments)    Skin rips  . Penicillins Nausea And Vomiting  . Sulfa Antibiotics Other (See Comments)    Sleeplessness, joint pain, cramps  . Tape     Skin rips    Family History: Family History  Problem Relation Age of Onset  . Heart disease Father   . Prostate cancer Neg Hx   . Kidney cancer Neg Hx   . Kidney disease Neg Hx     Social History:  reports that she has never smoked. She has never used smokeless tobacco. She reports that she does not drink alcohol or use drugs.  ROS: UROLOGY Frequent Urination?: Yes Hard to postpone urination?: Yes Burning/pain with urination?: No Get up at night to urinate?: Yes Leakage of urine?: Yes Urine stream starts and stops?: Yes Trouble starting stream?: No Do you have to strain to urinate?: No Blood in urine?: No Urinary tract infection?: Yes Sexually transmitted disease?: No Injury to kidneys or bladder?: No Painful intercourse?: No Weak stream?: No Currently pregnant?: No Vaginal bleeding?: No Last menstrual period?: n  Gastrointestinal Nausea?: No Vomiting?: No Indigestion/heartburn?: No Diarrhea?: Yes Constipation?: No  Constitutional Fever: Yes Night sweats?: No Weight loss?: No Fatigue?: No  Skin Skin rash/lesions?: No Itching?: No  Eyes Blurred vision?: No Double vision?: No  Ears/Nose/Throat Sore throat?: No Sinus problems?:  Yes  Hematologic/Lymphatic Swollen glands?: No Easy bruising?: Yes  Cardiovascular Leg swelling?: No Chest pain?: No  Respiratory Cough?: No Shortness of breath?: Yes  Endocrine Excessive thirst?: No  Musculoskeletal Back pain?: Yes Joint pain?: Yes  Neurological Headaches?: No Dizziness?: No  Psychologic Depression?: No Anxiety?: No  Physical Exam: BP (!) 164/92   Pulse 76   Ht _0  (1.702 m)   Wt 172 lb 3.2 oz (78.1 kg)  BMI 26.97 kg/m   Constitutional: Well nourished. Alert and oriented, No acute distress. HEENT: [place] AT, moist mucus membranes. Trachea midline, no masses. Cardiovascular: No clubbing, cyanosis, or edema. Respiratory: Normal respiratory effort, no increased work of breathing. GI: Abdomen is soft, non tender, non distended, no abdominal masses. Liver and spleen not palpable.  No hernias appreciated.  Stool sample for occult testing is not indicated.   GU: No CVA tenderness.  No bladder fullness or masses.  Normal external genitalia, normal pubic hair distribution, no lesions.  Normal urethral meatus, no lesions, no prolapse, no discharge.   No urethral masses, tenderness and/or tenderness. No bladder fullness, tenderness or masses. Normal vagina mucosa, good estrogen effect, no discharge, no lesions, good pelvic support, no Grade II cystocele is noted.  No rectocele noted.  Cervix, uterus and adnexa are surgically absent.  Anus and perineum are without rashes or lesions.    Skin: No rashes, bruises or suspicious lesions. Lymph: No cervical or inguinal adenopathy. Neurologic: Grossly intact, no focal deficits, moving all 4 extremities. Psychiatric: Normal mood and affect.  Laboratory Data: Lab Results  Component Value Date   WBC 6.2 09/21/2016   HGB 11.3 (L) 09/21/2016   HCT 34.8 (L) 09/21/2016   MCV 83.2 09/21/2016   PLT 285 09/21/2016    Lab Results  Component Value Date   CREATININE 1.92 (H) 09/21/2016     Lab Results  Component  Value Date   HGBA1C 6.4 (H) 09/23/2011    Lab Results  Component Value Date   TSH 3.560 06/27/2012       Component Value Date/Time   CHOL 123 09/23/2011 0440   HDL 62 09/23/2011 0440   CHOLHDL 2.0 09/23/2011 0440   VLDL 17 09/23/2011 0440   LDLCALC 44 09/23/2011 0440    Lab Results  Component Value Date   AST 33 09/21/2016   Lab Results  Component Value Date   ALT 28 09/21/2016     Pertinent Imaging: Results for FRAN, NEISWONGER (MRN [id number]) as of 10/10/2016 10:24  Ref. Range 10/10/2016 09:31  Scan Result Unknown 243    Assessment & Plan:    1. Frequency  - patient may have renal abnormality  - she is on Lasix and has CKD  - will hold on addressing frequency until CT scan is complete  2. Nocturia  - will hold on addressing nocturia until CT scan is complete  3. Mixed incontinence  - will hold on addressing nocturia until CT scan is complete  4. Vaginal atrophy  - I explained to the patient that when women go through menopause and her estrogen levels are severely diminished, the normal vaginal flora will change.  This is due to an increase of the vaginal canal's pH. Because of this, the vaginal canal may be colonized by bacteria from the rectum instead of the protective lactobacillus.  This accompanied by the loss of the mucus barrier with vaginal atrophy is a cause of recurrent urinary tract infections.  - In some studies, the use of vaginal estrogen cream has been demonstrated to reduce  recurrent urinary tract infections to one a year.   - Patient was given a sample of vaginal estrogen cream (Premarin/Estrace) and instructed to apply 0.'5mg'$  (pea-sized amount)  just inside the vaginal introitus with a finger-tip every night for two weeks and then Monday, Wednesday and Friday nights.  I explained to the patient that vaginally administered estrogen, which causes only a slight increase in the blood estrogen levels, have  fewer contraindications and adverse systemic  effects that oral HT.  5. Cystocele  - will start patient on vaginal estrogen cream today  6. ?Solitary kidney  - will obtain non contrast CT for further evaluation  7. Microscopic hematuria  - will obtain a non contrast CT due to her CKD   Return for RTC for CT report.  These notes generated with voice recognition software. I apologize for typographical errors.  Zara Council, [facility] [address] [place],  [address] [phone]

## 2016-10-20 ENCOUNTER — Emergency Department
Admission: EM | Admit: 2016-10-20 | Discharge: 2016-10-20 | Disposition: A | Discharge status: Home / Self Care | Payer: Medicare Other | Attending: Emergency Medicine | Admitting: Emergency Medicine

## 2016-10-20 ENCOUNTER — Emergency Department: Payer: Medicare Other

## 2016-10-20 DIAGNOSIS — Y929 Unspecified place or not applicable: Secondary | ICD-10-CM | POA: Insufficient documentation

## 2016-10-20 DIAGNOSIS — Y939 Activity, unspecified: Secondary | ICD-10-CM | POA: Diagnosis not present

## 2016-10-20 DIAGNOSIS — Z7984 Long term (current) use of oral hypoglycemic drugs: Secondary | ICD-10-CM | POA: Insufficient documentation

## 2016-10-20 DIAGNOSIS — I129 Hypertensive chronic kidney disease with stage 1 through stage 4 chronic kidney disease, or unspecified chronic kidney disease: Secondary | ICD-10-CM | POA: Diagnosis not present

## 2016-10-20 DIAGNOSIS — Z79899 Other long term (current) drug therapy: Secondary | ICD-10-CM | POA: Diagnosis not present

## 2016-10-20 DIAGNOSIS — Y999 Unspecified external cause status: Secondary | ICD-10-CM | POA: Insufficient documentation

## 2016-10-20 DIAGNOSIS — N183 Chronic kidney disease, stage 3 (moderate): Secondary | ICD-10-CM | POA: Diagnosis not present

## 2016-10-20 DIAGNOSIS — S8262XA Displaced fracture of lateral malleolus of left fibula, initial encounter for closed fracture: Secondary | ICD-10-CM | POA: Diagnosis not present

## 2016-10-20 DIAGNOSIS — W1839XA Other fall on same level, initial encounter: Secondary | ICD-10-CM | POA: Diagnosis not present

## 2016-10-20 DIAGNOSIS — R930 Abnormal findings on diagnostic imaging of skull and head, not elsewhere classified: Secondary | ICD-10-CM | POA: Insufficient documentation

## 2016-10-20 DIAGNOSIS — I2510 Atherosclerotic heart disease of native coronary artery without angina pectoris: Secondary | ICD-10-CM | POA: Insufficient documentation

## 2016-10-20 DIAGNOSIS — S8992XA Unspecified injury of left lower leg, initial encounter: Secondary | ICD-10-CM | POA: Diagnosis present

## 2016-10-20 DIAGNOSIS — E1122 Type 2 diabetes mellitus with diabetic chronic kidney disease: Secondary | ICD-10-CM | POA: Insufficient documentation

## 2016-10-20 MED ORDER — OXYCODONE-ACETAMINOPHEN 5-325 MG PO TABS
1.0000 | ORAL_TABLET | Freq: Once | ORAL | Status: AC
  Administered 2016-10-20: 1 via ORAL

## 2016-10-20 MED ORDER — OXYCODONE-ACETAMINOPHEN 5-325 MG PO TABS
ORAL_TABLET | ORAL | Status: AC
  Filled 2016-10-20: qty 1

## 2016-10-20 MED ORDER — OXYCODONE-ACETAMINOPHEN 5-325 MG PO TABS: 1.0000 | ORAL_TABLET | ORAL | 0 refills | Status: DC | PRN

## 2016-10-20 NOTE — ED Triage Notes (Signed)
Pt presents via [facility] from home after a fall. Pt states she twisted and fell on to an ottoman. Denies hitting head. Denies LOC. Pt states she twisted ankles and feet. Bilat ankle and foot pain. L ankle more swollen then R. Pt able to wiggle toes and move ankles. + pedal pulses. Warm to touch, good color. Alert and oriented. No distress noted.

## 2016-10-20 NOTE — Discharge Instructions (Signed)
Please keep both your ankles elevated, the level of your heart. You may apply ice to both your ankles anterior right knee for 10 minutes every 2 hours to decrease swelling and pain.  You may weight-bear as tolerated. Please keep the splint on at all times until you're cleared by the orthopedist to remove it.  Return to the emergency department for severe pain, numbness tingling or weakness, headache, vomiting, or any other symptoms concerning to you

## 2016-10-20 NOTE — ED Notes (Signed)
Pt taken to CT via stretcher.

## 2016-10-20 NOTE — ED Notes (Signed)
Pt bilat extremities elevated at this time and ice bags placed on ankles. Will re assess

## 2016-10-20 NOTE — ED Notes (Signed)
Amy Chavez [phone]

## 2016-10-20 NOTE — ED Provider Notes (Signed)
[facility] Emergency Department Provider Note  ____________________________________________  Time seen: Approximately 2:35 PM  I have reviewed the triage vital signs and the nursing notes.   HISTORY  Chief Complaint Fall    HPI Amy Chavez is a 83 y.o. female who lives at home presenting with bilateral ankle, right knee pain after a fall. The patient stood up from the couch, and "both my ankles rolled over" causing her to fall. She fell onto her knees and has pain in the right knee. She then thinks she may have hit her face on the ottoman, but is not sure. She takes Plavix. She has no headache, nausea or vomiting, visual changes. At no time did she have loss of consciousness or syncope, lightheadedness, chest pain, palpitations or shortness of breath. She has not had any recent illness or medication changes. She was unable to and bili after the fall.   Past Medical History:  Diagnosis Date  . Anxiety   . Cancer ([place])   . Coronary artery disease   . Diabetes mellitus   . Glaucoma   . Heartburn   . Hernia   . Hypercholesterolemia   . Hypertension   . Kidney failure   . UTI (urinary tract infection)     Patient Active Problem List   Diagnosis Date Noted  . Chest pain 09/22/2011  . Diabetes mellitus, type 2 ([place]) 09/22/2011  . CAD (coronary artery disease) 09/22/2011  . HLD (hyperlipidemia) 09/22/2011  . CKD (chronic kidney disease), stage III 09/22/2011  . Solitary kidney, acquired 09/22/2011  . History of colon cancer 09/22/2011  . HTN (hypertension) 09/22/2011    Past Surgical History:  Procedure Laterality Date  . ABDOMINAL HYSTERECTOMY    . CATARACT EXTRACTION, BILATERAL Bilateral    2008  . CHOLECYSTECTOMY    . COLON SURGERY    . TOE SURGERY Right    1998    Current Outpatient Rx  . Order #: [id number] Class: Historical Med  . Order #: [id number] Class: Historical Med  . Order #: [id number] Class: Historical Med  . Order #:  [id number] Class: Historical Med  . Order #: [id number] Class: Historical Med  . Order #: [id number] Class: Historical Med  . Order #: [id number] Class: Historical Med  . Order #: [id number] Class: Historical Med  . Order #: [id number] Class: Historical Med  . Order #: [id number] Class: Historical Med  . Order #: [id number] Class: Historical Med  . Order #: [id number] Class: Historical Med  . Order #: [id number] Class: Historical Med  . Order #: [id number] Class: Historical Med  . Order #: [id number] Class: Historical Med  . Order #: [id number] Class: Historical Med  . Order #: [id number] Class: Historical Med  . Order #: [id number] Class: Historical Med    Allergies Cephalexin; Ciprofloxacin; Other; Penicillins; Sulfa antibiotics; and Tape  Family History  Problem Relation Age of Onset  . Heart disease Father   . Prostate cancer Neg Hx   . Kidney cancer Neg Hx   . Kidney disease Neg Hx     Social History Social History  Substance Use Topics  . Smoking status: Never Smoker  . Smokeless tobacco: Never Used  . Alcohol use No    Review of Systems Constitutional: No fever/chills.No lightheadedness or syncope. Positive mechanical fall. Eyes: No visual changes. No blurred or double vision. ENT: No sore throat. No congestion or rhinorrhea. Cardiovascular: Denies chest pain. Denies palpitations. Respiratory: Denies shortness of breath.  No cough. Gastrointestinal: No abdominal pain.  No nausea, no vomiting.  No diarrhea.  No constipation. Genitourinary: Negative for dysuria.  Musculoskeletal: Negative for back pain. Positive bilateral ankle pain. Positive right knee pain. Negative bilateral hip pain, neck pain, or upper extremity pain. Skin: Negative for rash. Neurological: Negative for headaches. No focal numbness, tingling or weakness.   10-point ROS otherwise negative.  ____________________________________________   PHYSICAL EXAM:  VITAL SIGNS: ED Triage Vitals  Enc Vitals Group     BP 10/20/16 1337 (!)  185/68     Pulse Rate 10/20/16 1337 73     Resp 10/20/16 1337 18     Temp 10/20/16 1337 97.3 F (36.3 C)     Temp Source 10/20/16 1337 Oral     SpO2 10/20/16 1337 100 %     Weight --      Height --      Head Circumference --      Peak Flow --      Pain Score 10/20/16 1338 8     Pain Loc --      Pain Edu? --      Excl. in [place]? --     Constitutional: Alert and oriented. Well appearing and in no acute distress. Answers questions appropriately. Eyes: Conjunctivae are normal.  EOMI. No scleral icterus. Head: Atraumatic. No raccoon eyes. No Battle sign. Nose: No congestion/rhinnorhea. No swelling over thenose septal hematoma. Mouth/Throat: Mucous membranes are moist. No malocclusion or dental injury. Neck: No stridor.  Supple.  Full range of motion without pain. No midline C-spine tenderness to palpation, step-offs or deformities. Cardiovascular: Normal rate, regular rhythm. No murmurs, rubs or gallops.  Respiratory: Normal respiratory effort.  No accessory muscle use or retractions. Lungs CTAB.  No wheezes, rales or ronchi. Gastrointestinal: Soft, nontender and nondistended.  No guarding or rebound.  No peritoneal signs. Musculoskeletal: No LE edema. 2 x 1 7 m area of ecchymosis on the medial inferior aspect of the right knee with small effusion but full range of motion without pain. Positive swelling with mild ecchymosis on the lateral aspect of the left ankle. Pain with range of motion of the right and left ankles. No pain with range of motion of the left knee. Full range of motion without pain of the bilateral hips; pelvis is stable. No tenderness to palpation in the midline in the T or L-spine, no step-offs or deformities. Normal DP and PT pulses bilaterally. Normal sensation to light touch in the bilateral lower extremities. No upper extremity joint swelling or pain with range of motion. Neurologic:  A&Ox3.  Speech is clear.  Face and smile are symmetric.  EOMI.  Moves all extremities  well. Skin:  Skin is warm, dry and intact. No rash noted. Psychiatric: Mood and affect are normal. Speech and behavior are normal.  Normal judgement.  ____________________________________________   LABS (all labs ordered are listed, but only abnormal results are displayed)  Labs Reviewed - No data to display ____________________________________________  EKG  ED ECG REPORT I, Eula Listen, the attending physician, personally viewed and interpreted this ECG.   Date: 10/20/2016  EKG Time: 1338  Rate: 77  Rhythm: normal sinus rhythm  Axis: Normal  Intervals:none  ST&T Change: Patient has a prominent T wave isolated to V2. No ST elevation. No STEMI.  ____________________________________________  RADIOLOGY  No results found.  ____________________________________________   PROCEDURES  Procedure(s) performed: None  Procedures  Critical Care performed: No ____________________________________________   INITIAL IMPRESSION / ASSESSMENT AND PLAN / ED COURSE  Pertinent labs & imaging results that were available during my care of the patient were reviewed by me  and considered in my medical decision making (see chart for details).  83 y.o. with a mechanical fall resenting with bilateral ankle, right knee pain, and possibly hit face on an ottoman and is anticoagulated with Plavix. We'll get a CT head, bilateral ankle and right knee x-rays, and treat the patient symptomatically. Her EKG does not show any ischemic changes and she does not have any signs or symptoms of syncope. Plan reevaluation for final disposition.  SPLINT APPLICATION Date/Time: 0000000 PM Authorized by: Eula Listen Consent: Verbal consent obtained. Risks and benefits: risks, benefits and alternatives were discussed Consent given by: patient Splint applied by: ED technician Location details: Left Ankle Splint type: Stirrup Supplies used: Orthoglass Post-procedure: The splinted body part was  neurovascularly unchanged following the procedure. Patient tolerance: Patient tolerated the procedure well with no immediate complications.    ____________________________________________  FINAL CLINICAL IMPRESSION(S) / ED DIAGNOSES  Final diagnoses:  None         NEW MEDICATIONS STARTED DURING THIS VISIT:  New Prescriptions   No medications on file      Eula Listen, MD 10/20/16 1553

## 2016-10-20 NOTE — ED Notes (Signed)
Ankle splint done by crystal NT

## 2016-10-22 ENCOUNTER — Emergency Department: Payer: Medicare Other

## 2016-10-22 ENCOUNTER — Emergency Department
Admission: EM | Admit: 2016-10-22 | Discharge: 2016-10-22 | Disposition: A | Discharge status: Home / Self Care | Payer: Medicare Other | Attending: Emergency Medicine | Admitting: Emergency Medicine

## 2016-10-22 DIAGNOSIS — W1839XD Other fall on same level, subsequent encounter: Secondary | ICD-10-CM | POA: Insufficient documentation

## 2016-10-22 DIAGNOSIS — I2510 Atherosclerotic heart disease of native coronary artery without angina pectoris: Secondary | ICD-10-CM | POA: Diagnosis not present

## 2016-10-22 DIAGNOSIS — N183 Chronic kidney disease, stage 3 (moderate): Secondary | ICD-10-CM | POA: Diagnosis not present

## 2016-10-22 DIAGNOSIS — E875 Hyperkalemia: Secondary | ICD-10-CM

## 2016-10-22 DIAGNOSIS — N390 Urinary tract infection, site not specified: Secondary | ICD-10-CM

## 2016-10-22 DIAGNOSIS — Z79899 Other long term (current) drug therapy: Secondary | ICD-10-CM | POA: Insufficient documentation

## 2016-10-22 DIAGNOSIS — E1122 Type 2 diabetes mellitus with diabetic chronic kidney disease: Secondary | ICD-10-CM | POA: Diagnosis not present

## 2016-10-22 DIAGNOSIS — R32 Unspecified urinary incontinence: Secondary | ICD-10-CM

## 2016-10-22 DIAGNOSIS — R262 Difficulty in walking, not elsewhere classified: Secondary | ICD-10-CM

## 2016-10-22 DIAGNOSIS — I129 Hypertensive chronic kidney disease with stage 1 through stage 4 chronic kidney disease, or unspecified chronic kidney disease: Secondary | ICD-10-CM | POA: Insufficient documentation

## 2016-10-22 DIAGNOSIS — S86911D Strain of unspecified muscle(s) and tendon(s) at lower leg level, right leg, subsequent encounter: Secondary | ICD-10-CM | POA: Diagnosis not present

## 2016-10-22 DIAGNOSIS — S8991XD Unspecified injury of right lower leg, subsequent encounter: Secondary | ICD-10-CM | POA: Diagnosis present

## 2016-10-22 DIAGNOSIS — N189 Chronic kidney disease, unspecified: Secondary | ICD-10-CM

## 2016-10-22 LAB — CBC WITH DIFFERENTIAL/PLATELET
BASOS PCT: 0 %
Basophils Absolute: 0 10*3/uL (ref 0–0.1)
Eosinophils Absolute: 0.1 10*3/uL (ref 0–0.7)
Eosinophils Relative: 1 %
HEMATOCRIT: 31.7 % — AB (ref 35.0–47.0)
HEMOGLOBIN: 9.9 g/dL — AB (ref 12.0–16.0)
LYMPHS PCT: 9 %
Lymphs Abs: 1.1 10*3/uL (ref 1.0–3.6)
MCH: 25.6 pg — ABNORMAL LOW (ref 26.0–34.0)
MCHC: 31.3 g/dL — AB (ref 32.0–36.0)
MCV: 81.6 fL (ref 80.0–100.0)
Monocytes Absolute: 1.3 10*3/uL — ABNORMAL HIGH (ref 0.2–0.9)
Monocytes Relative: 10 %
NEUTROS ABS: 10.2 10*3/uL — AB (ref 1.4–6.5)
NEUTROS PCT: 80 %
Platelets: 297 10*3/uL (ref 150–440)
RBC: 3.88 MIL/uL (ref 3.80–5.20)
RDW: 13 % (ref 11.5–14.5)
WBC: 12.7 10*3/uL — ABNORMAL HIGH (ref 3.6–11.0)

## 2016-10-22 LAB — URINALYSIS, ROUTINE W REFLEX MICROSCOPIC
Bilirubin Urine: NEGATIVE
Glucose, UA: NEGATIVE mg/dL
Hgb urine dipstick: NEGATIVE
Ketones, ur: NEGATIVE mg/dL
Nitrite: NEGATIVE
PH: 5 (ref 5.0–8.0)
Protein, ur: 30 mg/dL — AB
SPECIFIC GRAVITY, URINE: 1.014 (ref 1.005–1.030)

## 2016-10-22 LAB — COMPREHENSIVE METABOLIC PANEL
ALBUMIN: 3.4 g/dL — AB (ref 3.5–5.0)
ALK PHOS: 90 U/L (ref 38–126)
ALT: 10 U/L — ABNORMAL LOW (ref 14–54)
ANION GAP: 7 (ref 5–15)
AST: 13 U/L — AB (ref 15–41)
BILIRUBIN TOTAL: 0.6 mg/dL (ref 0.3–1.2)
BUN: 34 mg/dL — AB (ref 6–20)
CALCIUM: 9 mg/dL (ref 8.9–10.3)
CO2: 21 mmol/L — ABNORMAL LOW (ref 22–32)
Chloride: 103 mmol/L (ref 101–111)
Creatinine, Ser: 1.94 mg/dL — ABNORMAL HIGH (ref 0.44–1.00)
GFR calc Af Amer: 25 mL/min — ABNORMAL LOW (ref >60)
GFR, EST NON AFRICAN AMERICAN: 22 mL/min — AB (ref >60)
GLUCOSE: 232 mg/dL — AB (ref 65–99)
Potassium: 5.7 mmol/L — ABNORMAL HIGH (ref 3.5–5.1)
Sodium: 131 mmol/L — ABNORMAL LOW (ref 135–145)
Total Protein: 7.2 g/dL (ref 6.5–8.1)

## 2016-10-22 MED ORDER — HYDROCODONE-ACETAMINOPHEN 5-325 MG PO TABS: 1.0000 | ORAL_TABLET | ORAL | 0 refills | Status: AC | PRN

## 2016-10-22 MED ORDER — ONDANSETRON HCL 4 MG/2ML IJ SOLN
INTRAMUSCULAR | Status: AC
  Administered 2016-10-22: 4 mg via INTRAVENOUS
  Filled 2016-10-22: qty 2

## 2016-10-22 MED ORDER — ONDANSETRON HCL 4 MG/2ML IJ SOLN
4.0000 mg | INTRAMUSCULAR | Status: AC
  Administered 2016-10-22: 4 mg via INTRAVENOUS

## 2016-10-22 MED ORDER — SODIUM CHLORIDE 0.9 % IV BOLUS (SEPSIS)
500.0000 mL | INTRAVENOUS | Status: AC
  Administered 2016-10-22: 500 mL via INTRAVENOUS

## 2016-10-22 MED ORDER — MORPHINE SULFATE (PF) 2 MG/ML IV SOLN
2.0000 mg | Freq: Once | INTRAVENOUS | Status: AC
Start: 2016-10-22 — End: 2016-10-22
  Administered 2016-10-22: 2 mg via INTRAVENOUS
  Filled 2016-10-22: qty 1

## 2016-10-22 NOTE — ED Provider Notes (Signed)
[facility] Emergency Department Provider Note  ____________________________________________   First MD Initiated Contact with Patient 10/22/16 1126     (approximate)  I have reviewed the triage vital signs and the nursing notes.   HISTORY  Chief Complaint Knee Pain    HPI Amy Chavez is a 83 y.o. female who was seen in the ED 2 days ago after a mechanical fall who presents for worsening pain and swelling and inability to ambulate and her right knee.  She takes Plavix and had a mechanical fall and possibly struck her head.  She had a thorough evaluation including CT scan of her head and radiographs of her right kneeand both ankles.  Her right knee films and right ankle films were encouraging with no evidence of acute injury, but she did have a small nondisplaced lateral malleolar fracture on the left.  She was splinted and sent home with home health care once daily.  They were supposed to come today but the family brought her here instead because it is extremely hard for them to get her around.  She also suffers from urinary incontinence and they cannot adequately change her and change the bed due to the pain in her right knee and her inability to assist.  They are concerned she may need rehabilitation.  She denies fever/chills, chest pain, shortness of breath, nausea, vomiting, abdominal pain, dysuria, diarrhea.  She has chronic incontinence and has plans for further urological/renal evaluation next week.  Her daughter states that she also has a chronic urinary tract infection.  She complains of severe pain in the right knee worse with movement, flexion and extension, and rest makes it a little bit better.  She has no pain in her head or her neck.  Her ankle feels better in the splint.   Past Medical History:  Diagnosis Date  . Anxiety   . Cancer ([place])   . Coronary artery disease   . Diabetes mellitus   . Glaucoma   . Heartburn   . Hernia   .  Hypercholesterolemia   . Hypertension   . Kidney failure   . UTI (urinary tract infection)     Patient Active Problem List   Diagnosis Date Noted  . Chest pain 09/22/2011  . Diabetes mellitus, type 2 ([place]) 09/22/2011  . CAD (coronary artery disease) 09/22/2011  . HLD (hyperlipidemia) 09/22/2011  . CKD (chronic kidney disease), stage III 09/22/2011  . Solitary kidney, acquired 09/22/2011  . History of colon cancer 09/22/2011  . HTN (hypertension) 09/22/2011    Past Surgical History:  Procedure Laterality Date  . ABDOMINAL HYSTERECTOMY    . CATARACT EXTRACTION, BILATERAL Bilateral    2008  . CHOLECYSTECTOMY    . COLON SURGERY    . TOE SURGERY Right    1998    Prior to Admission medications   Medication Sig Start Date End Date Taking? Authorizing Provider  acetaminophen (TYLENOL) 500 MG tablet Take 500 mg by mouth every 6 (six) hours as needed.   Yes Historical Provider, MD  ALPRAZolam (XANAX) 0.25 MG tablet Take 0.25 mg by mouth daily as needed. For anxiety   Yes Historical Provider, MD  clopidogrel (PLAVIX) 75 MG tablet Take 75 mg by mouth daily.   Yes Historical Provider, MD  Cranberry 500 MG CAPS Take 500 mg by mouth daily.    Yes Historical Provider, MD  cyanocobalamin (,VITAMIN B-12,) 1000 MCG/ML injection Inject 1,000 mcg into the muscle every 4 (four) months.  Yes Historical Provider, MD  cyanocobalamin 500 MCG tablet Take 1,000 mcg by mouth daily.   Yes Historical Provider, MD  esomeprazole (NEXIUM) 40 MG capsule Take 40 mg by mouth daily before breakfast.   Yes Historical Provider, MD  ezetimibe-simvastatin (VYTORIN) 10-40 MG per tablet Take 1 tablet by mouth at bedtime.   Yes Historical Provider, MD  ferrous sulfate 325 (65 FE) MG tablet Take 325 mg by mouth daily with breakfast.   Yes Historical Provider, MD  fexofenadine (ALLEGRA) 180 MG tablet Take 180 mg by mouth daily as needed. For allergies   Yes Historical Provider, MD  glyBURIDE (DIABETA) 2.5 MG tablet Take  5 mg by mouth daily with breakfast.    Yes Historical Provider, MD  oxyCODONE-acetaminophen (ROXICET) 5-325 MG tablet Take 1 tablet by mouth every 4 (four) hours as needed for severe pain. 10/20/16 10/20/17 Yes Anne-Caroline Mariea Clonts, MD  vitamin C (ASCORBIC ACID) 500 MG tablet Take 500 mg by mouth daily.   Yes Historical Provider, MD  Blood Glucose Monitoring Suppl (FIFTY50 GLUCOSE METER 2.0) w/Device KIT Use to test blood sugars once daily.  Dx code: E11.9 10/05/15   Historical Provider, MD  cholecalciferol (VITAMIN D) 1000 units tablet Take 1,000 Units by mouth daily.    Historical Provider, MD  HYDROcodone-acetaminophen (NORCO/VICODIN) 5-325 MG tablet Take 1 tablet by mouth every 4 (four) hours as needed for moderate pain. 10/22/16   Hinda Kehr, MD    Allergies Cephalexin; Ciprofloxacin; Other; Penicillins; Sulfa antibiotics; and Tape  Family History  Problem Relation Age of Onset  . Heart disease Father   . Prostate cancer Neg Hx   . Kidney cancer Neg Hx   . Kidney disease Neg Hx     Social History Social History  Substance Use Topics  . Smoking status: Never Smoker  . Smokeless tobacco: Never Used  . Alcohol use No    Review of Systems Constitutional: No fever/chills Eyes: No visual changes. ENT: No sore throat. Cardiovascular: Denies chest pain. Respiratory: Denies shortness of breath. Gastrointestinal: No abdominal pain.  No nausea, no vomiting.  No diarrhea.  No constipation. Genitourinary: Negative for dysuria. Musculoskeletal: Worsening pain and swelling in right knee.  Stable mild pain in left ankle with splint in place. Skin: Negative for rash. Neurological: Negative for headaches, focal weakness or numbness.  10-point ROS otherwise negative.  ____________________________________________   PHYSICAL EXAM:  VITAL SIGNS: ED Triage Vitals  Enc Vitals Group     BP 10/22/16 1042 128/81     Pulse Rate 10/22/16 1042 89     Resp 10/22/16 1042 17     Temp 10/22/16  1042 98 F (36.7 C)     Temp Source 10/22/16 1042 Oral     SpO2 10/22/16 1042 96 %     Weight 10/22/16 1043 172 lb (78 kg)     Height 10/22/16 1043 5' 7"  (1.702 m)     Head Circumference --      Peak Flow --      Pain Score --      Pain Loc --      Pain Edu? --      Excl. in [place]? --     Constitutional: Alert and oriented. Well appearing and in no acute distress. Eyes: Conjunctivae are normal. PERRL. EOMI. Head: Atraumatic. Nose: No congestion/rhinnorhea. Mouth/Throat: Mucous membranes are moist. Neck: No stridor.  No meningeal signs.   Cardiovascular: Normal rate, regular rhythm. Good peripheral circulation. Grossly normal heart sounds. Respiratory: Normal respiratory effort.  No retractions. Lungs CTAB. Gastrointestinal: Soft and nontender. No distention.  Musculoskeletal: Mild swelling of the right knee but without erythema.  Left and right knees are equally warm to palpation with no evidence of cellulitis or joint infection on the right.  There is mild tenderness to palpation and reproducible pain/tenderness with flexion and extension of the right knee although range of motion is not limited.  Because the left ankle is stable I did not remove the splint as it is doing its job.  She is neurovascularly intact distally. Neurologic:  Normal speech and language. No gross focal neurologic deficits are appreciated.  Skin:  Skin is warm, dry and intact. No rash noted. Psychiatric: Mood and affect are normal. Speech and behavior are normal.  ____________________________________________   LABS (all labs ordered are listed, but only abnormal results are displayed)  Labs Reviewed  CBC WITH DIFFERENTIAL/PLATELET - Abnormal; Notable for the following:       Result Value   WBC 12.7 (*)    Hemoglobin 9.9 (*)    HCT 31.7 (*)    MCH 25.6 (*)    MCHC 31.3 (*)    Neutro Abs 10.2 (*)    Monocytes Absolute 1.3 (*)    All other components within normal limits  COMPREHENSIVE METABOLIC PANEL -  Abnormal; Notable for the following:    Sodium 131 (*)    Potassium 5.7 (*)    CO2 21 (*)    Glucose, Bld 232 (*)    BUN 34 (*)    Creatinine, Ser 1.94 (*)    Albumin 3.4 (*)    AST 13 (*)    ALT 10 (*)    GFR calc non Af Amer 22 (*)    GFR calc Af Amer 25 (*)    All other components within normal limits  URINALYSIS, ROUTINE W REFLEX MICROSCOPIC - Abnormal; Notable for the following:    Color, Urine YELLOW (*)    APPearance CLOUDY (*)    Protein, ur 30 (*)    Leukocytes, UA LARGE (*)    Bacteria, UA RARE (*)    Squamous Epithelial / LPF 0-5 (*)    All other components within normal limits  URINE CULTURE   ____________________________________________  EKG  ED ECG REPORT I, Belanna Manring, the attending physician, personally viewed and interpreted this ECG.  Date: 10/22/2016 EKG Time: 13:01 Rate: 77 Rhythm: normal sinus rhythm QRS Axis: normal Intervals: normal ST/T Wave abnormalities: Tall T waves in lead V2, otherwise unremarkable Conduction Disturbances: none Narrative Interpretation: unremarkable  ____________________________________________  RADIOLOGY   Ct Knee Right Wo Contrast  Result Date: 10/22/2016 CLINICAL DATA:  Pt here c/o worsening rt knee pain from her fall on Thursday, pt states that she was seen in the ER and treated for a fractured left ankle, pt states that she is having such pain in the rt knee she can not get up. EXAM: CT OF THE RIGHT KNEE WITHOUT CONTRAST TECHNIQUE: Multidetector CT imaging of the RIGHT knee was performed according to the standard protocol. Multiplanar CT image reconstructions were also generated. COMPARISON:  None. FINDINGS: Bones/Joint/Cartilage Generalized osteopenia. No fracture or dislocation. Normal alignment. No joint effusion. Medial scratch them mild medial femorotibial compartment joint space narrowing with subchondral sclerosis and marginal osteophytosis consistent with osteoarthritis. Mild patellofemoral compartment  osteoarthritis. Chondrocalcinosis of the medial and lateral femorotibial compartments as can be seen with CPPD. Large joint effusion. Ligaments Ligaments are suboptimally evaluated by CT. Muscles and Tendons Muscles are normal. No muscle atrophy.  Intact quadriceps tendon and patellar tendon. Soft tissue No fluid collection or hematoma. No soft tissue mass. Peripheral vascular atherosclerotic disease. IMPRESSION: 1.  No acute osseous injury of the right knee. 2. Large joint effusion of the right knee. 3. Mild osteoarthritis of the medial femorotibial compartment. Electronically Signed   By: Kathreen Devoid   On: 10/22/2016 12:38    ____________________________________________   PROCEDURES  Procedure(s) performed:   Procedures   Critical Care performed: No ____________________________________________   INITIAL IMPRESSION / ASSESSMENT AND PLAN / ED COURSE  Pertinent labs & imaging results that were available during my care of the patient were reviewed by me and considered in my medical decision making (see chart for details).  I suspect that patient does not have an emergent medical condition but they are having a great deal of trouble getting around and caring for her given her subacute injuries.  However I will attempt to rule out a bony abnormality missed on plain radiographs with a noncontrast CT scan of the right knee.  I spoke by phone with Dr. Rudene Christians who confirmed this would be his imaging of choice and he would not recommend an MRI at this time.  After the CT scan results are back I will attempt to contact PT/OT and social work for placement.  Patient and daughter agree with the plan.   Clinical Course as of Oct 22 1444  Sat Oct 22, 2016  1201 Contacted PT, was given name of Guerry Minors ([phone]), the therapist who will do the evaluation.  I will contact them and put in the consult as soon as the CT scan results are back.  [CF]  1202 Performing standard medical screening exam anticipating  outpatient placement.  Patient has chronic UTIs and chronic urinary incontinence, but will check a UA and urine culture to make sure appropriate outpatient management is initiated.  [CF]  4098 The patient's kidney function is at baseline.  Her potassium is slightly elevated at 5.7 and I will give a small fluid bolus but there is no indication for inpatient management given that she is otherwise asymptomatic.  I will check an EKG.  She has a urinary tract infection but looking back she always has urinary tract infection.  She has allergies listed to most classes of antibiotics.  Urine cultures in the past have showed mixed flora with no specific pathogen.  Given that she is asymptomatic with no dysuria, chronic urinary incontinence, and has follow-up planned with urology and/or renal, I will not place her on empiric antibiotics at this time and will instead let the culture Drive the treatment as needed as an outpatient.  Claudine with social work is working on placement for her and I spoke by phone with Personnel officer (PT/OT) who will perform the evaluation and treatment recommendations.  [CF]  1191 Social work found placement in a local rehab facility.  No acute/emergent medical issues requiring medication treatment.  Will recommend outpatient follow up in a few days with PCP for lab work to make sure potassium is appropriate.  Writing another script for pain medication.  Also recommend patient follows up with urology as planned.  [CF]    Clinical Course User Index [CF] Hinda Kehr, MD    ____________________________________________  FINAL CLINICAL IMPRESSION(S) / ED DIAGNOSES  Final diagnoses:  Knee strain, right, subsequent encounter  Hyperkalemia, diminished renal excretion  Chronic kidney disease, unspecified CKD stage  Chronic UTI (urinary tract infection)  Urinary incontinence, unspecified type     MEDICATIONS GIVEN DURING  THIS VISIT:  Medications  morphine 2 MG/ML injection 2 mg (2 mg  Intravenous Given 10/22/16 1158)  ondansetron (ZOFRAN) injection 4 mg (4 mg Intravenous Given 10/22/16 1158)  sodium chloride 0.9 % bolus 500 mL (0 mLs Intravenous Stopped 10/22/16 1435)     NEW OUTPATIENT MEDICATIONS STARTED DURING THIS VISIT:  New Prescriptions   HYDROCODONE-ACETAMINOPHEN (NORCO/VICODIN) 5-325 MG TABLET    Take 1 tablet by mouth every 4 (four) hours as needed for moderate pain.    Modified Medications   No medications on file    Discontinued Medications   ASPIRIN 81 MG CHEWABLE TABLET    Chew 81 mg by mouth daily.   FUROSEMIDE (LASIX) 20 MG TABLET    Take 20 mg by mouth daily as needed. For edema   LOSARTAN (COZAAR) 25 MG TABLET    Take 25 mg by mouth daily.   MULTIVITAMIN (RENA-VIT) TABS TABLET    Take 1 tablet by mouth daily.   RANITIDINE (ZANTAC) 300 MG CAPSULE    Take 300 mg by mouth every evening.      Note:  This document was prepared using Dragon voice recognition software and may include unintentional dictation errors.    Hinda Kehr, MD 10/22/16 [phone]

## 2016-10-22 NOTE — ED Notes (Signed)
EDP at bedside. Pt states she took a percocet today for pain. Has not been able to get up and use the bathroom and has been incontinent.

## 2016-10-22 NOTE — Progress Notes (Signed)
Patient has been offered a bed a Engineer, water for [facility], LCSW will consult with EDP and ED RN and Patient    Lyanne Kates LCSW

## 2016-10-22 NOTE — ED Notes (Signed)
[facility] notified that patient was en route to hospital via EMS by this RN.

## 2016-10-22 NOTE — ED Notes (Signed)
This RN to bedside to introduce self to patient. Pt noted to be resting in bed. Denies any needs at this time. This RN dimmed lights for patient comfort at this time. This RN emphasized use of call bell to patient, pt states understanding at this time.

## 2016-10-22 NOTE — NC FL2 (Signed)
[place] LEVEL OF CARE SCREENING TOOL     IDENTIFICATION  Patient Name: Amy Chavez Birthdate: 1926-06-04 Sex: female Admission Date (Current Location): 10/22/2016  [place] and [place] Number:  Engineering geologist and Address:  [facility], [address], [place], [place] [address]      Provider Number: [id number]  Attending Physician Name and Address:  Hinda Kehr, MD  Relative Name and Phone Number:       Current Level of Care: Home Recommended Level of Care: [place] Prior Approval Number:    Date Approved/Denied:   PASRR Number: [id number]  Discharge Plan: SNF    Current Diagnoses: Patient Active Problem List   Diagnosis Date Noted  . Chest pain 09/22/2011  . Diabetes mellitus, type 2 ([place]) 09/22/2011  . CAD (coronary artery disease) 09/22/2011  . HLD (hyperlipidemia) 09/22/2011  . CKD (chronic kidney disease), stage III 09/22/2011  . Solitary kidney, acquired 09/22/2011  . History of colon cancer 09/22/2011  . HTN (hypertension) 09/22/2011    Orientation RESPIRATION BLADDER Height & Weight     Self, Time, Situation, Place  Normal Incontinent Weight: 172 lb (78 kg) Height:  5' 7"  (170.2 cm)  BEHAVIORAL SYMPTOMS/MOOD NEUROLOGICAL BOWEL NUTRITION STATUS      Incontinent Diet (Diabetic/cardiac )  AMBULATORY STATUS COMMUNICATION OF NEEDS Skin   Limited Assist Verbally Normal                       Personal Care Assistance Level of Assistance  Bathing, Feeding, Dressing, Total care Bathing Assistance: Limited assistance Feeding assistance: Independent Dressing Assistance: Limited assistance Total Care Assistance: Limited assistance   Functional Limitations Info  Sight, Hearing, Speech Sight Info: Adequate Hearing Info: Adequate Speech Info: Adequate    SPECIAL CARE FACTORS FREQUENCY  PT (By licensed PT), OT (By licensed OT)     PT Frequency: x5 OT Frequency: x5             Contractures      Additional Factors Info  Code Status, Allergies Code Status Info: full Allergies Info: Cephalexin,Ciproflaxin, sulpha antibiotics,tape other penicillins           Current Medications (10/22/2016):  This is the current hospital active medication list No current facility-administered medications for this encounter.    Current Outpatient Prescriptions  Medication Sig Dispense Refill  . ALPRAZolam (XANAX) 0.25 MG tablet Take 0.25 mg by mouth daily as needed. For anxiety    . aspirin 81 MG chewable tablet Chew 81 mg by mouth daily.    . Blood Glucose Monitoring Suppl (FIFTY50 GLUCOSE METER 2.0) w/Device KIT Use to test blood sugars once daily.  Dx code: E11.9    . cholecalciferol (VITAMIN D) 1000 units tablet Take 1,000 Units by mouth daily.    . clopidogrel (PLAVIX) 75 MG tablet Take 75 mg by mouth daily.    . Cranberry 500 MG CAPS Take 500 mg by mouth daily.     . cyanocobalamin (,VITAMIN B-12,) 1000 MCG/ML injection Inject 1,000 mcg into the muscle every 6 (six) months.     . cyanocobalamin 500 MCG tablet Take 1,000 mcg by mouth daily.    Marland Kitchen esomeprazole (NEXIUM) 40 MG capsule Take 40 mg by mouth daily before breakfast.    . ezetimibe-simvastatin (VYTORIN) 10-40 MG per tablet Take 1 tablet by mouth at bedtime.    . ferrous sulfate 325 (65 FE) MG tablet Take 325 mg by mouth daily with breakfast.    .  fexofenadine (ALLEGRA) 180 MG tablet Take 180 mg by mouth daily as needed. For allergies    . furosemide (LASIX) 20 MG tablet Take 20 mg by mouth daily as needed. For edema    . glyBURIDE (DIABETA) 2.5 MG tablet Take 5 mg by mouth daily with breakfast.     . losartan (COZAAR) 25 MG tablet Take 25 mg by mouth daily.    . multivitamin (RENA-VIT) TABS tablet Take 1 tablet by mouth daily.    Marland Kitchen oxyCODONE-acetaminophen (ROXICET) 5-325 MG tablet Take 1 tablet by mouth every 4 (four) hours as needed for severe pain. 15 tablet 0  . ranitidine (ZANTAC) 300 MG capsule Take 300 mg  by mouth every evening.     . vitamin C (ASCORBIC ACID) 500 MG tablet Take 500 mg by mouth daily.       Discharge Medications: Please see discharge summary for a list of discharge medications.  Relevant Imaging Results:  Relevant Lab Results:   Additional Information SSN [id number]  Joana Reamer, [place]

## 2016-10-22 NOTE — ED Triage Notes (Signed)
Pt here c/o worsening rt knee pain from her fall on Thursday, pt states that she was seen in the ER and treated for a fractured left ankle, pt states that she is having such pain in the rt knee she can not get up. Pt has swelling and a lot of heat noted to the right knee

## 2016-10-22 NOTE — Discharge Instructions (Signed)
As we discussed, your medical workup is essentially normal for you.  Your potassium was a little bit high today and we gave you a small bolus of IV fluids to help bring it down.  It is likely the result of your kidney function being less than normal running away.  We do recommend that you follow-up with your regular doctor within 3 days or so for repeat lab work to make sure your electrolytes are within normal limits.  Please also follow up with your orthopedic surgeon for the left ankle fracture as well as the right knee sprain/strain.  You continue to have a urinary tract infection but this is a chronic problem with which you are working with your primary care doctor.  We sent a urine culture but are not starting antibiotics at this time because her last urine culture had no specific organism to treat, and you have a number of drug allergies listed.  Please follow-up with your urologist, nephrologist, or primary care doctor at the next available opportunity regarding your urinary incontinence as well as the chronic urinary tract infection.  Return to the emergency department if you develop new or worsening symptoms that concern you.

## 2016-10-22 NOTE — ED Notes (Signed)
Pt D/C and transferred to [facility] via [place]. Pt alert and oriented upon D/C, NAD Noted at this time.

## 2016-10-22 NOTE — Progress Notes (Signed)
LCSW complete assessment with patient and her daughter, started Fl2- awaiting EDP to sign off, PT consult has been ordered awaiting report. Sent CSW initial assessment to facilities and Fl2 and awaiting potential bed offers.  [employer] LCSW [phone]

## 2016-10-22 NOTE — Clinical Social Work Note (Signed)
Clinical Social Work Assessment  Patient Details  Name: Amy Chavez MRN: [id number] Date of Birth: 1926/01/27  Date of referral:  10/22/16               Reason for consult:  Facility Placement                Permission sought to share information with:  Family Supports, Customer service manager Permission granted to share information::  Yes, Verbal Permission Granted  Name::     Daughter Jamerria Deturk [phone]  Agency::  All Facilities  Relationship::     Contact Information:     Housing/Transportation Living arrangements for the past 2 months:  [place] of Information:  Patient, Adult Children Patient Interpreter Needed:  None Criminal Activity/Legal Involvement Pertinent to Current Situation/Hospitalization:  No - Comment as needed Significant Relationships:  Adult Children, Other Family Members Lives with:  Adult Children, Minor Children Do you feel safe going back to the place where you live?  No Need for family participation in patient care:  Yes (Comment)  Care giving concerns: Daughter has a serious heart issue and had surgury less than 1 year ago, and I child with IDD she is unable to assist her Mom with transfers or lifting at this time.   Social Worker assessment / plan:  LCSW introduced myself to patient and obtained verbal consent to speak to family members and facilities to assist with SNF placement. This patient agreed and is oriented x4 and has UHC/Medicare. She has come to the ED today as she fractures her left ankle and then today has injured her right knee. Her daughter has serious medical issues and limitations and is not able to assist her mother at home ( lifting or transfers) Patient has [facility] health that will start on Monday but family reports they are not enough care that her Mom requires. ADL's patient now requires full assistance, unable to walk, very weak in upper body at this time, and incontinent. Patient has diabetes type  2 and would benefit from STR to become stronger. Family is very supportive but are insistent she requires more care than can be provided at home.  Employment status:  Retired Forensic scientist:  Information systems manager, Other (Comment Required) (UHC/Medicare) PT Recommendations:  [place] / Referral to community resources:  [place]  Patient/Family's Response to care:  oatient agrees she needs more help now with left ankle and right knee issues  Patient/Family's Understanding of and Emotional Response to Diagnosis, Current Treatment, and Prognosis: Daughter herself unable to provide extra support if her Mom falls, is unable to lift anything over 10 lbs.  Emotional Assessment Appearance:  Appears stated age Attitude/Demeanor/Rapport:   (Polite and oriented x4) Affect (typically observed):  Accepting, Calm, Appropriate Orientation:  Oriented to Self, Oriented to Place, Oriented to  Time, Oriented to Situation Alcohol / Substance use:  Not Applicable Psych involvement (Current and /or in the community):  No (Comment)  Discharge Needs  Concerns to be addressed:  Care Coordination Readmission within the last 30 days:    Current discharge risk:  Dependent with Mobility Barriers to Discharge:  Continued Medical Work up   Joana Reamer, LCSW 10/22/2016, 12:51 PM

## 2016-10-22 NOTE — ED Notes (Signed)
Pt visualized in NAD, pt resting in bed with eyes closed at this time, respirations even and unlabored at this time. Will continue to monitor for further patient needs.

## 2016-10-22 NOTE — Progress Notes (Signed)
LCSW met with family and confirmed patient has a bed offer and accepted [facility]- This information was shared with ED RN and ED P and ED secretary. Patient will be transported by EMS and Daughter Lillette Boxer will meet Shirlee Limerick at 4 pm for admission documentation.  LCSW provided small lunch to patient as she was hungry and it was OK's by EDP.  It was explained to Doctor any pain scripts had to be written out for the facility patient.  [facility] LCSW [phone]

## 2016-10-22 NOTE — Evaluation (Signed)
Physical Therapy Evaluation Patient Details Name: JOHNESHA BECKER MRN: [id number] DOB: 03-04-1926 Today's Date: 10/22/2016   History of Present Illness  83 y/o female who had a fall and suffered a non-displaced L ankle fx as well as R knee injury.  She/family has been unable to do any mobility at home and pt has had increased pain.  Clinical Impression  Pt showed good effort with bed mobility and with light general testing but was very pain limited with resisted activities and especially with attempt at standing.  Pt with splint on L ankle and imaging revealing no boney abnormality in R knee but ultimately she could not tolerate even heavily assisted attempt at standing.  Pt is unable to safely return home and will need rehab to get back to her baseline of ambulation with occasional AD use.     Follow Up Recommendations SNF    Equipment Recommendations       Recommendations for Other Services       Precautions / Restrictions Precautions Precautions: Fall Restrictions Weight Bearing Restrictions: Yes (WBAT b/l LEs)      Mobility  Bed Mobility Overal bed mobility: Needs Assistance Bed Mobility: Supine to Sit;Sit to Supine     Supine to sit: Min guard Sit to supine: Min guard   General bed mobility comments: Pt very slow, labored and cautious with getting to EOB.  She was heavily reliant onusing UEs to get to sitting but was slowly able to get herself to standing  Transfers Overall transfer level: Needs assistance Equipment used: Rolling walker (2 wheeled) Transfers: Sit to/from Stand Sit to Stand: Total assist         General transfer comment: Pt unable to get to standing even with heavy cuing and excessive assist to try to get her to upright.  Pt c/o too much pain and though she made some effort quickly showed she was unable to tolerate it.  Ambulation/Gait                Stairs            Wheelchair Mobility    Modified Rankin (Stroke Patients  Only)       Balance Overall balance assessment: Needs assistance Sitting-balance support: Single extremity supported Sitting balance-Leahy Scale: Good Sitting balance - Comments: Pt able to maintain sitting balance relatively well     Standing balance-Leahy Scale: Zero Standing balance comment: unable to achieve standing                             Pertinent Vitals/Pain Pain Assessment: 0-10 Pain Score: 9  Pain Location: R knee worse than L ankle    Home Living Family/patient expects to be discharged to:: Skilled nursing facility Living Arrangements: Children Available Help at Discharge: Family                  Prior Function Level of Independence: Independent with assistive device(s)         Comments: used cane/rollator out of the house, no AD in the home, cared for 3 grand children     Hand Dominance        Extremity/Trunk Assessment   Upper Extremity Assessment Upper Extremity Assessment: Generalized weakness (age appropriate deficits)    Lower Extremity Assessment Lower Extremity Assessment: Generalized weakness (very pain limited, b/l LEs R>L, tolerated only light resist)       Communication   Communication: No difficulties  Cognition Arousal/Alertness: Awake/alert  Behavior During Therapy: WFL for tasks assessed/performed Overall Cognitive Status: Within Functional Limits for tasks assessed                      General Comments      Exercises     Assessment/Plan    PT Assessment Patient needs continued PT services  PT Problem List Decreased strength;Decreased range of motion;Decreased activity tolerance;Decreased balance;Decreased mobility;Decreased knowledge of use of DME;Decreased safety awareness;Decreased knowledge of precautions;Pain          PT Treatment Interventions DME instruction;Gait training;Stair training;Therapeutic exercise;Balance training;Neuromuscular re-education;Therapeutic  activities;Patient/family education    PT Goals (Current goals can be found in the Care Plan section)  Acute Rehab PT Goals Patient Stated Goal: control the pain PT Goal Formulation: With patient Time For Goal Achievement: 11/05/16 Potential to Achieve Goals: Fair    Frequency Min 2X/week   Barriers to discharge        Co-evaluation               End of Session Equipment Utilized During Treatment: Gait belt Activity Tolerance: Patient limited by pain Patient left: in bed;with call bell/phone within reach Nurse Communication: Mobility status    Functional Assessment Tool Used: clinical judgement Functional Limitation: Mobility: Walking and moving around Mobility: Walking and Moving Around Current Status [id number]): 100 percent impaired, limited or restricted Mobility: Walking and Moving Around Goal Status [phone]): At least 40 percent but less than 60 percent impaired, limited or restricted    Time: 1258-1329 PT Time Calculation (min) (ACUTE ONLY): 31 min   Charges:   PT Evaluation $PT Eval Low Complexity: 1 Procedure     PT G Codes:   PT G-Codes **NOT FOR INPATIENT CLASS** Functional Assessment Tool Used: clinical judgement Functional Limitation: Mobility: Walking and moving around Mobility: Walking and Moving Around Current Status [id number]): 100 percent impaired, limited or restricted Mobility: Walking and Moving Around Goal Status [id number]): At least 40 percent but less than 60 percent impaired, limited or restricted    Kreg Shropshire, DPT 10/22/2016, 1:52 PM

## 2016-10-22 NOTE — ED Notes (Signed)
Social worker at bedside.

## 2016-10-22 NOTE — Progress Notes (Signed)
LCSW received consult request for Dr Karma Greaser and will start the assessment and Fl2 process to see if this patient can be placed.  [facility]  [phone]

## 2016-10-24 ENCOUNTER — Ambulatory Visit: Payer: Medicare Other

## 2016-10-24 LAB — URINE CULTURE: Special Requests: NORMAL

## 2016-10-25 NOTE — Progress Notes (Signed)
Pt ucx results with 40,000 colonies of viridans strep. Spoke with Dr. Burlene Arnt who recommended pt follow up op, possibly have cx retaken.  Pt was supposed to be following up with urologist, Zara Council this week, but when I called office they said appointment had been canceled since pt was unable to get CT. States they would leave note for PA McGowan to look at ED notes. Also told her that I would call facility [facility]) to inform them of results. Called [facility] and left message on Soil scientist about faxing over results- requesting fax #.  Also sent message to PCP Dr. Ginette Pitman with results and suggesting possibly recollecting urine sample.  Ramond Dial, Pharm.D, BCPS Clinical Pharmacist

## 2016-10-26 ENCOUNTER — Encounter: Payer: Self-pay | Admitting: Internal Medicine

## 2016-10-26 ENCOUNTER — Ambulatory Visit: Payer: Medicare Other | Admitting: Urology

## 2016-10-26 ENCOUNTER — Non-Acute Institutional Stay (SKILLED_NURSING_FACILITY): Payer: Medicare Other | Admitting: Internal Medicine

## 2016-10-26 DIAGNOSIS — E1122 Type 2 diabetes mellitus with diabetic chronic kidney disease: Secondary | ICD-10-CM

## 2016-10-26 DIAGNOSIS — E875 Hyperkalemia: Secondary | ICD-10-CM

## 2016-10-26 DIAGNOSIS — N390 Urinary tract infection, site not specified: Secondary | ICD-10-CM

## 2016-10-26 DIAGNOSIS — I10 Essential (primary) hypertension: Secondary | ICD-10-CM

## 2016-10-26 DIAGNOSIS — J40 Bronchitis, not specified as acute or chronic: Secondary | ICD-10-CM

## 2016-10-26 DIAGNOSIS — N183 Chronic kidney disease, stage 3 unspecified: Secondary | ICD-10-CM

## 2016-10-26 DIAGNOSIS — D649 Anemia, unspecified: Secondary | ICD-10-CM | POA: Diagnosis not present

## 2016-10-26 NOTE — Progress Notes (Signed)
Location:  [facility] Room Number: 349-Z Place of Service:  SNF [phone]) Provider:  Mahima Dion Saucier, MD  Patient Care Team: Tracie Harrier, MD as PCP - General (Internal Medicine)  Extended Emergency Contact Information Primary Emergency Contact: Bonneville,Helene Address: [address]          [place], [place] [address] [place] Phone: [phone] Relation: Daughter  Code Status:  Full Code Goals of care: Advanced Directive information Advanced Directives 10/20/2016  Does Patient Have a Medical Advance Directive? No;Yes  Type of Advance Directive Healthcare Power of Attorney  Would patient like information on creating a medical advance directive? -     Chief Complaint  Patient presents with  . New Admit To SNF    New Admission Visit     HPI:  Pt is a 83 y.o. female seen today for medical management of chronic diseases.   Patient Has h/o Recurrent UTI, CAD S/P Stent, Diabetes Mellitus, GERD, Hypertension, anemia with HGB of 11, And Chronic renal disease with Creat of 1.9 Patient Initially fell at home. She was found to have Small Non Displace Lateral Malleolar Fracture of Left ankle. She was splinted and send home. She also had injury to her Right knee With some swelling but no fracture. She was initially sent home with Home Care nurse but she came back to ED as it was hard for her daughter who recently had Heart surgery to take care of her. She is now admitted to SNF for therapy. She was independent before this fall and was living with her Daughter.  She is also C/O Cough with productive sputum of yellowish color. No SOB,CHest Pain or Fever. I   Past Medical History:  Diagnosis Date  . Anxiety   . Cancer ([place])   . Coronary artery disease   . Diabetes mellitus   . Glaucoma   . Heartburn   . Hernia   . Hypercholesterolemia   . Hypertension   . Kidney failure   . UTI (urinary tract infection)    Past  Surgical History:  Procedure Laterality Date  . ABDOMINAL HYSTERECTOMY    . CATARACT EXTRACTION, BILATERAL Bilateral    2008  . CHOLECYSTECTOMY    . COLON SURGERY    . TOE SURGERY Right    1998    Allergies  Allergen Reactions  . Cephalexin Other (See Comments)    Low back pain  . Ciprofloxacin Other (See Comments)  . Other Other (See Comments)    Skin rips  . Penicillins Nausea And Vomiting  . Sulfa Antibiotics Other (See Comments)    Sleeplessness, joint pain, cramps  . Tape     Skin rips    Allergies as of 10/26/2016      Reactions   Cephalexin Other (See Comments)   Low back pain   Ciprofloxacin Other (See Comments)   Other Other (See Comments)   Skin rips   Penicillins Nausea And Vomiting   Sulfa Antibiotics Other (See Comments)   Sleeplessness, joint pain, cramps   Tape    Skin rips      Medication List       Accurate as of 10/26/16 10:32 AM. Always use your most recent med list.          acetaminophen 500 MG tablet Commonly known as:  TYLENOL Take 500 mg by mouth every 6 (six) hours as needed.   ALPRAZolam 0.25 MG tablet Commonly known as:  Duanne Moron  Take 0.25 mg by mouth daily as needed. For anxiety   Cholecalciferol 1000 units capsule Take 2,000 Units by mouth daily.   clopidogrel 75 MG tablet Commonly known as:  PLAVIX Take 75 mg by mouth daily.   Cranberry 500 MG Caps Take 500 mg by mouth daily.   cyanocobalamin 500 MCG tablet Take 500 mcg by mouth daily.   cyanocobalamin 1000 MCG/ML injection Commonly known as:  (VITAMIN B-12) Inject 1,000 mcg into the muscle every 4 (four) months.   esomeprazole 40 MG capsule Commonly known as:  NEXIUM Take 40 mg by mouth daily before breakfast.   ezetimibe-simvastatin 10-40 MG tablet Commonly known as:  VYTORIN Take 1 tablet by mouth at bedtime.   ferrous sulfate 325 (65 FE) MG tablet Take 325 mg by mouth daily with breakfast.   fexofenadine 180 MG tablet Commonly known as:  ALLEGRA Take 180  mg by mouth daily as needed. For allergies   FIFTY50 GLUCOSE METER 2.0 w/Device Kit Use to test blood sugars once daily.  Dx code: E11.9   glyBURIDE 2.5 MG tablet Commonly known as:  DIABETA Take 2.5 mg by mouth daily with breakfast.   HYDROcodone-acetaminophen 5-325 MG tablet Commonly known as:  NORCO/VICODIN Take 1 tablet by mouth every 4 (four) hours as needed for moderate pain.   oxyCODONE-acetaminophen 5-325 MG tablet Commonly known as:  ROXICET Take 1 tablet by mouth every 4 (four) hours as needed for severe pain.   vitamin C 500 MG tablet Commonly known as:  ASCORBIC ACID Take 500 mg by mouth daily.       Review of Systems  Constitutional: Positive for activity change. Negative for appetite change, chills, fatigue and fever.  HENT: Positive for congestion, postnasal drip and rhinorrhea.   Respiratory: Positive for cough. Negative for choking, chest tightness and shortness of breath.   Cardiovascular: Positive for leg swelling. Negative for chest pain and palpitations.  Gastrointestinal: Negative for abdominal distention, abdominal pain, constipation, diarrhea and nausea.  Genitourinary: Positive for frequency and urgency. Negative for dysuria.  Musculoskeletal: Positive for gait problem and joint swelling. Negative for arthralgias and back pain.  Skin: Negative.   Neurological: Negative for dizziness, speech difficulty, weakness and light-headedness.  Psychiatric/Behavioral: Negative.     Immunization History  Administered Date(s) Administered  . Influenza Split 06/27/2012   Pertinent  Health Maintenance Due  Topic Date Due  . FOOT EXAM  10[birth year]18/1935  . OPHTHALMOLOGY EXAM  10/18/1937  . URINE MICROALBUMIN  10/18/1937  . DEXA SCAN  10/18/1992  . PNA vac Low Risk Adult (1 of 2 - PCV13) 10/18/1992  . HEMOGLOBIN A1C  03/22/2012  . INFLUENZA VACCINE  04/12/2016   No flowsheet data found. Functional Status Survey:    Vitals:   10/26/16 1024  BP: (!) 117/98    Pulse: 88  Resp: 18  Temp: 98.2 F (36.8 C)  TempSrc: Oral  SpO2: 91%  Weight: 172 lb (78 kg)  Height: _0  (1.702 m)   Body mass index is 26.94 kg/m. Physical Exam  Constitutional: She is oriented to person, place, and time. She appears well-developed and well-nourished.  HENT:  Head: Normocephalic.  Mouth/Throat: Oropharynx is clear and moist.  Eyes: Pupils are equal, round, and reactive to light.  Neck: Neck supple.  Cardiovascular: Normal rate, regular rhythm and normal heart sounds.   Pulmonary/Chest: Effort normal.  Few rales B/L  Abdominal: Soft. Bowel sounds are normal. She exhibits no distension. There is no tenderness. There is no rebound.  Musculoskeletal:  B/L Trace edema  Lymphadenopathy:    She has no cervical adenopathy.  Neurological: She is alert and oriented to person, place, and time.  No focal deficit. Exam was limited due to her Pain in the LE  Skin: Skin is warm and dry.  Psychiatric: She has a normal mood and affect. Her behavior is normal. Judgment and thought content normal.    Labs reviewed:  Recent Labs  09/17/16 1611 09/21/16 1055 10/22/16 1151  NA 131* 138 131*  K 4.7 4.7 5.7*  CL 101 109 103  CO2 22 21* 21*  GLUCOSE 224* 144* 232*  BUN 26* 39* 34*  CREATININE 1.53* 1.92* 1.94*  CALCIUM 9.0 9.4 9.0    Recent Labs  09/17/16 1611 09/21/16 1055 10/22/16 1151  AST 33 33 13*  ALT 26 28 10*  ALKPHOS 94 100 90  BILITOT 0.8 0.6 0.6  PROT 7.4 7.8 7.2  ALBUMIN 3.5 3.9 3.4*    Recent Labs  09/17/16 1611 09/21/16 1055 10/22/16 1151  WBC 10.4 6.2 12.7*  NEUTROABS 9.6*  --  10.2*  HGB 11.4* 11.3* 9.9*  HCT 35.5 34.8* 31.7*  MCV 83.5 83.2 81.6  PLT 201 285 297   Lab Results  Component Value Date   TSH 3.560 06/27/2012   Lab Results  Component Value Date   HGBA1C 6.4 (H) 09/23/2011   Lab Results  Component Value Date   CHOL 123 09/23/2011   HDL 62 09/23/2011   LDLCALC 44 09/23/2011   TRIG 87 09/23/2011   CHOLHDL  2.0 09/23/2011    Significant Diagnostic Results in last 30 days:  Dg Ankle Complete Left  Result Date: 10/20/2016 CLINICAL DATA:  Fall today.  Left ankle pain. EXAM: LEFT ANKLE COMPLETE - 3+ VIEW COMPARISON:  None. FINDINGS: Diffuse soft tissue swelling, most pronounced laterally. Lucency at the tip of the lateral malleolus compatible with nondisplaced fracture. No tibial abnormality. IMPRESSION: Nondisplaced fracture through the tip of the lateral malleolus. Overlying soft tissue swelling. Electronically Signed   By: Rolm Baptise M.D.   On: 10/20/2016 15:10   Dg Ankle Complete Right  Result Date: 10/20/2016 CLINICAL DATA:  Fall, pain EXAM: RIGHT ANKLE - COMPLETE 3+ VIEW COMPARISON:  None. FINDINGS: No acute bony abnormality. Specifically, no fracture, subluxation, or dislocation. Soft tissues are intact. IMPRESSION: No acute bony abnormality. Electronically Signed   By: Rolm Baptise M.D.   On: 10/20/2016 15:10   Ct Head Wo Contrast  Result Date: 10/20/2016 CLINICAL DATA:  The patient tripped and fell over an ottoman today. EXAM: CT HEAD WITHOUT CONTRAST TECHNIQUE: Contiguous axial images were obtained from the base of the skull through the vertex without intravenous contrast. COMPARISON:  None. FINDINGS: Brain: No acute abnormality including hemorrhage, infarct, mass lesion, mass effect, midline shift or abnormal extra-axial fluid collection. No hydrocephalus or pneumocephalus. Vascular: Atherosclerosis noted. Skull: Intact. Sinuses/Orbits: Mucosal thickening and air-fluid levels are seen in the maxillary sinuses bilaterally. There is scattered ethmoid air cell disease and mild mucosal thickening in the right sphenoid sinus. Other: None. IMPRESSION: No acute intracranial abnormality. Findings consistent with acute bilateral maxillary sinusitis. Scattered ethmoid air cell disease and mild mucosal thickening right sphenoid sinus also noted. Atherosclerosis. Electronically Signed   By: Inge Rise  M.D.   On: 10/20/2016 14:53   Ct Knee Right Wo Contrast  Result Date: 10/22/2016 CLINICAL DATA:  Pt here c/o worsening rt knee pain from her fall on Thursday, pt states that she was seen in the ER  and treated for a fractured left ankle, pt states that she is having such pain in the rt knee she can not get up. EXAM: CT OF THE RIGHT KNEE WITHOUT CONTRAST TECHNIQUE: Multidetector CT imaging of the RIGHT knee was performed according to the standard protocol. Multiplanar CT image reconstructions were also generated. COMPARISON:  None. FINDINGS: Bones/Joint/Cartilage Generalized osteopenia. No fracture or dislocation. Normal alignment. No joint effusion. Medial scratch them mild medial femorotibial compartment joint space narrowing with subchondral sclerosis and marginal osteophytosis consistent with osteoarthritis. Mild patellofemoral compartment osteoarthritis. Chondrocalcinosis of the medial and lateral femorotibial compartments as can be seen with CPPD. Large joint effusion. Ligaments Ligaments are suboptimally evaluated by CT. Muscles and Tendons Muscles are normal. No muscle atrophy. Intact quadriceps tendon and patellar tendon. Soft tissue No fluid collection or hematoma. No soft tissue mass. Peripheral vascular atherosclerotic disease. IMPRESSION: 1.  No acute osseous injury of the right knee. 2. Large joint effusion of the right knee. 3. Mild osteoarthritis of the medial femorotibial compartment. Electronically Signed   By: Kathreen Devoid   On: 10/22/2016 12:38   Dg Knee Complete 4 Views Right  Result Date: 10/20/2016 CLINICAL DATA:  Fall today.  Pain, bruising. EXAM: RIGHT KNEE - COMPLETE 4+ VIEW COMPARISON:  None. FINDINGS: Mild chondrocalcinosis. Joint space narrowing and spurring, most pronounced in the medial and patellofemoral compartments. No acute bony abnormality. Specifically, no fracture, subluxation, or dislocation. Soft tissues are intact. No joint effusion. IMPRESSION: Mild degenerative changes  and chondrocalcinosis. No acute bony abnormality. Electronically Signed   By: Rolm Baptise M.D.   On: 10/20/2016 15:11    Assessment/Plan Type 2 diabetes mellitus   Patient BS in the facility are running more then 200 as she has been on reduced dose of Glyburide. Increase the dose to her Home dose of 5 mg BID.  Essential hypertension BP well controlled . She is not on any meds. Per her She is not taking Lasix or Cozaar anymore. Continue to monitor.  Bronchitis Patient has this cough which is now worsening with Productive sputum  Will start her on Zithromax  Recurrent UTI Asymptomatic right now . Last culture was negative.  CKD (chronic kidney disease), stage III Last Creat in the ED was slightly high as she ws dehydrated. Her Baseline is around 1.6. Will recheck agin  Anemia Her baseline Hgb is around 11 and it was low in the ED she does not have any acute Bleed. Will follow HGB She is on Iron supplement though patient says she doesn't take it.  Hyperkalemia Was given IV fluids in ED .Repeat BMP.     Family/ staff Communication:   Labs/tests ordered:  CMP,CBC,  Total time spent in this patient care encounter was 45_ minutes; greater than 50% of the visit spent counseling patient and her daughter. and coordinating care for problems addressed at this encounter.

## 2016-11-01 ENCOUNTER — Non-Acute Institutional Stay (SKILLED_NURSING_FACILITY): Payer: Medicare Other | Admitting: Family

## 2016-11-01 ENCOUNTER — Encounter: Payer: Self-pay | Admitting: Family

## 2016-11-01 DIAGNOSIS — N183 Chronic kidney disease, stage 3 unspecified: Secondary | ICD-10-CM

## 2016-11-01 DIAGNOSIS — E1122 Type 2 diabetes mellitus with diabetic chronic kidney disease: Secondary | ICD-10-CM

## 2016-11-02 NOTE — Progress Notes (Signed)
Location:  [facility] Room Number: 654-Y Place of Service:  SNF (31) Provider:  Nayellie Sanseverino FNP-C   Tracie Harrier, MD  Patient Care Team: Tracie Harrier, MD as PCP - General (Internal Medicine)  Extended Emergency Contact Information Primary Emergency Contact: Brim,Helene Address: [address]          [place], [place] [address] [place] Phone: [phone] Relation: Daughter  Code Status: Full Code  Goals of care: Advanced Directive information Advanced Directives 10/20/2016  Does Patient Have a Medical Advance Directive? No;Yes  Type of Advance Directive Healthcare Power of Attorney  Would patient like information on creating a medical advance directive? -     Chief Complaint  Patient presents with  . Acute Visit    High blood sugar    HPI:  Pt is a 83 y.o. female seen today at [facility] for an acute visit for evaluation of high blood sugars. She has a significant medical history of HTN, CAD,Type 2 DM, Hyperlipidemia, CKD stage 3 among other conditions. She is seen in her room today per facility Nurse supervisor request.fNurse reports patient's daughter on the phone upset that patient's CBG's was in the 400's. Patient's daughter on the phone states patient's CBG's are usually below 200 at home. She states patient's current diet has a lot of sugar added. Also states some CBG's are checked one hour after patient has had her meal tray instead of before meals.daughter states was in the room when a dinner tray was brought in and patient's CBG had not been checked. Facility Nurse supervisor Glory Buff. Aware. Patient and patient's daughter does not want any medication to be adjusted for now but would like  Her current CCD diet to be evaluated and heart health order added.Facility CBG's log reviewed CBG's ranging in the 110's-200's with x 2 readings noted 255( 10/30/2016) and 341 (10/29/2016).Discussed with patient  and daughter plan to check CBG's three times daily, added heart health to diet order and consult with Registered dietician for hyperglycemia and diet evaluation.Patient's daughter states patient was sweating and sleeping too much due to high blood sugar today.She denies any fever, chills or cough.   Past Medical History:  Diagnosis Date  . Anxiety   . Cancer ([place])   . Coronary artery disease   . Diabetes mellitus   . Glaucoma   . Heartburn   . Hernia   . Hypercholesterolemia   . Hypertension   . Kidney failure   . UTI (urinary tract infection)    Past Surgical History:  Procedure Laterality Date  . ABDOMINAL HYSTERECTOMY    . CATARACT EXTRACTION, BILATERAL Bilateral    2008  . CHOLECYSTECTOMY    . COLON SURGERY    . TOE SURGERY Right    1998    Allergies  Allergen Reactions  . Cephalexin Other (See Comments)    Low back pain  . Ciprofloxacin Other (See Comments)  . Other Other (See Comments)    Skin rips  . Penicillins Nausea And Vomiting  . Sulfa Antibiotics Other (See Comments)    Sleeplessness, joint pain, cramps  . Tape     Skin rips    Allergies as of 11/01/2016      Reactions   Cephalexin Other (See Comments)   Low back pain   Ciprofloxacin Other (See Comments)   Other Other (See Comments)   Skin rips   Penicillins Nausea And Vomiting   Sulfa Antibiotics Other (  See Comments)   Sleeplessness, joint pain, cramps   Tape    Skin rips      Medication List       Accurate as of 11/01/16 11:59 PM. Always use your most recent med list.          acetaminophen 500 MG tablet Commonly known as:  TYLENOL Take 500 mg by mouth every 6 (six) hours as needed.   ALPRAZolam 0.25 MG tablet Commonly known as:  XANAX Take 0.25 mg by mouth daily as needed. For anxiety   Cholecalciferol 1000 units capsule Take 2,000 Units by mouth daily.   clopidogrel 75 MG tablet Commonly known as:  PLAVIX Take 75 mg by mouth daily.   Cranberry 500 MG Caps Take 500 mg by  mouth daily.   cyanocobalamin 500 MCG tablet Take 500 mcg by mouth daily.   cyanocobalamin 1000 MCG/ML injection Commonly known as:  (VITAMIN B-12) Inject 1,000 mcg into the muscle every 4 (four) months.   esomeprazole 40 MG capsule Commonly known as:  NEXIUM Take 40 mg by mouth daily before breakfast.   ezetimibe-simvastatin 10-40 MG tablet Commonly known as:  VYTORIN Take 1 tablet by mouth at bedtime.   ferrous sulfate 325 (65 FE) MG tablet Take 325 mg by mouth daily with breakfast.   fexofenadine 180 MG tablet Commonly known as:  ALLEGRA Take 180 mg by mouth daily as needed. For allergies   FIFTY50 GLUCOSE METER 2.0 w/Device Kit Use to test blood sugars once daily.  Dx code: E11.9   glyBURIDE 5 MG tablet Commonly known as:  DIABETA Take 5 mg by mouth 2 (two) times daily.   HYDROcodone-acetaminophen 5-325 MG tablet Commonly known as:  NORCO/VICODIN Take 1 tablet by mouth every 4 (four) hours as needed for moderate pain.   oxyCODONE-acetaminophen 5-325 MG tablet Commonly known as:  ROXICET Take 1 tablet by mouth every 4 (four) hours as needed for severe pain.   vitamin C 500 MG tablet Commonly known as:  ASCORBIC ACID Take 500 mg by mouth daily.       Review of Systems  Constitutional: Negative for activity change, appetite change, chills, fatigue and fever.  HENT: Negative for congestion, rhinorrhea, sinus pain, sinus pressure, sneezing and sore throat.   Eyes: Negative.   Respiratory: Negative for cough, chest tightness, shortness of breath and wheezing.   Cardiovascular: Positive for leg swelling. Negative for chest pain and palpitations.  Gastrointestinal: Negative for abdominal distention, abdominal pain, constipation, diarrhea, nausea and vomiting.  Endocrine: Negative for cold intolerance, heat intolerance, polydipsia, polyphagia and polyuria.  Genitourinary: Negative for dysuria, flank pain, frequency and urgency.  Musculoskeletal: Positive for gait  problem.  Skin: Negative for color change, pallor and rash.  Neurological: Negative for dizziness, seizures, syncope, light-headedness and headaches.  Psychiatric/Behavioral: Negative for agitation, confusion, hallucinations and sleep disturbance. The patient is not nervous/anxious.     Immunization History  Administered Date(s) Administered  . Influenza Split 06/27/2012   Pertinent  Health Maintenance Due  Topic Date Due  . FOOT EXAM  10/18/1935  . OPHTHALMOLOGY EXAM  10/18/1935  . URINE MICROALBUMIN  10/18/1935  . DEXA SCAN  10/18/1992  . PNA vac Low Risk Adult (1 of 2 - PCV13) 10/18/1992  . HEMOGLOBIN A1C  03/22/2012  . INFLUENZA VACCINE  04/12/2016    Vitals:   11/01/16 1421  BP: 124/77  Resp: 18  Temp: 97.4 F (36.3 C)  TempSrc: Oral  SpO2: 93%  Weight: 172 lb (78 kg)  Height: 5' 7"  (1.702 m)   Body mass index is 26.94 kg/m. Physical Exam  Constitutional: She is oriented to person, place, and time. She appears well-developed and well-nourished. No distress.  HENT:  Head: Normocephalic.  Mouth/Throat: Oropharynx is clear and moist. No oropharyngeal exudate.  Eyes: Conjunctivae and EOM are normal. Pupils are equal, round, and reactive to light. Right eye exhibits no discharge. Left eye exhibits no discharge. No scleral icterus.  Neck: Normal range of motion. No JVD present. No thyromegaly present.  Cardiovascular: Normal rate, regular rhythm, normal heart sounds and intact distal pulses.  Exam reveals no gallop and no friction rub.   No murmur heard. Pulmonary/Chest: Effort normal and breath sounds normal. No respiratory distress. She has no wheezes. She has no rales.  Abdominal: Soft. Bowel sounds are normal. She exhibits no distension. There is no tenderness. There is no rebound and no guarding.  Musculoskeletal:  Moves x 4 extremities.Unsteady gait. Trace edema to lower extremities.  Lymphadenopathy:    She has no cervical adenopathy.  Neurological: She is  oriented to person, place, and time.  Skin: Skin is warm and dry. No rash noted. No erythema. No pallor.  Psychiatric: She has a normal mood and affect.    Labs reviewed:  Recent Labs  09/17/16 1611 09/21/16 1055 10/22/16 1151  NA 131* 138 131*  K 4.7 4.7 5.7*  CL 101 109 103  CO2 22 21* 21*  GLUCOSE 224* 144* 232*  BUN 26* 39* 34*  CREATININE 1.53* 1.92* 1.94*  CALCIUM 9.0 9.4 9.0    Recent Labs  09/17/16 1611 09/21/16 1055 10/22/16 1151  AST 33 33 13*  ALT 26 28 10*  ALKPHOS 94 100 90  BILITOT 0.8 0.6 0.6  PROT 7.4 7.8 7.2  ALBUMIN 3.5 3.9 3.4*    Recent Labs  09/17/16 1611 09/21/16 1055 10/22/16 1151  WBC 10.4 6.2 12.7*  NEUTROABS 9.6*  --  10.2*  HGB 11.4* 11.3* 9.9*  HCT 35.5 34.8* 31.7*  MCV 83.5 83.2 81.6  PLT 201 285 297   Lab Results  Component Value Date   TSH 3.560 06/27/2012   Lab Results  Component Value Date   HGBA1C 6.4 (H) 09/23/2011   Lab Results  Component Value Date   CHOL 123 09/23/2011   HDL 62 09/23/2011   LDLCALC 44 09/23/2011   TRIG 87 09/23/2011   CHOLHDL 2.0 09/23/2011    Assessment/Plan 1. Type 2 diabetes mellitus with stage 3 chronic kidney disease, without long-term current use of insulin  Reported CBG's in the 400's.Facility CBG's log reviewed CBG's ranging in the 110's-200's with x 2 readings noted 255( 10/30/2016) and 341 (10/29/2016).Discussed with patient and daughter plan to check CBG's three times daily, add heart health to diet order. Continue on CCD and consult with Registered dietician for hyperglycemia and diet evaluation.will obtain CBC/diff, BMP, TSH level and Hgb A1C 11/02/2016    Family/ staff Communication: Reviewed plan of care with patient, patient's daughter and Pharmacist, hospital.   Labs/tests ordered: CBC/diff, BMP, TSH level and Hgb A1C 11/02/2016

## 2016-11-11 ENCOUNTER — Non-Acute Institutional Stay (SKILLED_NURSING_FACILITY): Payer: Medicare Other | Admitting: Family

## 2016-11-11 ENCOUNTER — Ambulatory Visit
Admission: RE | Admit: 2016-11-11 | Discharge: 2016-11-11 | Disposition: A | Discharge status: Home / Self Care | Payer: Medicare Other | Source: Ambulatory Visit | Attending: Urology | Admitting: Urology

## 2016-11-11 ENCOUNTER — Encounter: Payer: Self-pay | Admitting: Family

## 2016-11-11 ENCOUNTER — Encounter (INDEPENDENT_AMBULATORY_CARE_PROVIDER_SITE_OTHER): Payer: Self-pay

## 2016-11-11 DIAGNOSIS — IMO0002 Reserved for concepts with insufficient information to code with codable children: Secondary | ICD-10-CM

## 2016-11-11 DIAGNOSIS — N183 Chronic kidney disease, stage 3 unspecified: Secondary | ICD-10-CM

## 2016-11-11 DIAGNOSIS — K5730 Diverticulosis of large intestine without perforation or abscess without bleeding: Secondary | ICD-10-CM | POA: Insufficient documentation

## 2016-11-11 DIAGNOSIS — N2889 Other specified disorders of kidney and ureter: Secondary | ICD-10-CM | POA: Insufficient documentation

## 2016-11-11 DIAGNOSIS — F411 Generalized anxiety disorder: Secondary | ICD-10-CM

## 2016-11-11 DIAGNOSIS — K449 Diaphragmatic hernia without obstruction or gangrene: Secondary | ICD-10-CM | POA: Diagnosis not present

## 2016-11-11 DIAGNOSIS — I700 Atherosclerosis of aorta: Secondary | ICD-10-CM | POA: Insufficient documentation

## 2016-11-11 DIAGNOSIS — I2510 Atherosclerotic heart disease of native coronary artery without angina pectoris: Secondary | ICD-10-CM | POA: Insufficient documentation

## 2016-11-11 DIAGNOSIS — R2681 Unsteadiness on feet: Secondary | ICD-10-CM | POA: Diagnosis not present

## 2016-11-11 DIAGNOSIS — E1122 Type 2 diabetes mellitus with diabetic chronic kidney disease: Secondary | ICD-10-CM | POA: Diagnosis not present

## 2016-11-11 DIAGNOSIS — N261 Atrophy of kidney (terminal): Secondary | ICD-10-CM | POA: Diagnosis not present

## 2016-11-11 DIAGNOSIS — I129 Hypertensive chronic kidney disease with stage 1 through stage 4 chronic kidney disease, or unspecified chronic kidney disease: Secondary | ICD-10-CM | POA: Diagnosis not present

## 2016-11-11 DIAGNOSIS — E782 Mixed hyperlipidemia: Secondary | ICD-10-CM | POA: Diagnosis not present

## 2016-11-11 DIAGNOSIS — M6281 Muscle weakness (generalized): Secondary | ICD-10-CM | POA: Diagnosis not present

## 2016-11-11 DIAGNOSIS — Q600 Renal agenesis, unilateral: Secondary | ICD-10-CM | POA: Diagnosis not present

## 2016-11-12 NOTE — Progress Notes (Signed)
11/14/2016 10:09 AM   Amy Chavez Jul 18, 1926 [id number]  Referring provider: Tracie Harrier, MD [address] [facility] [place], [place] [address]  Chief Complaint  Patient presents with  . Results    CT    HPI: 83 yo WF with CKD who presents today for to review the results of a non contrast CT performed to evaluate for a possible solitary kidney and microscopic hematuria.  Background history Patient is a 89 -year-old Caucasian female who is referred to [place] by, Dr. Ginette Chavez, for recurrent urinary tract infections who presents with her daughter, Amy Chavez.  Patient states that she has had every time she went to see Dr Amy Chavez she had an infection, over ten in the last year.  She states that she has infections when she becomes dehydrated and she doesn't feel right and the urine has a strong odor.  She endorses back pain   She denies dysuria, gross hematuria, suprapubic pain, abdominal pain or flank pain.   She has not had any recent fevers, chills, nausea or vomiting.  She does not have a history of nephrolithiasis or GU trauma.  Reviewing her records,  she has had one positive urine culture positive for aerococcus urinae .  She is not sexually active.  She is post menopausal.   She admits to diarrhea.  She does not engage in good perineal hygiene.  She does not take tub baths.  She does have incontinence.  She is using incontinence pads. She is not having pain with bladder filling.  She has not had any recent imaging studies in her chart, but she states that she has been told that one of her kidneys is shriveled up.  She is drinking three 16 ounces of water daily.  One cup of coffee in the am.  No sodas.  She is taking cranberry pills for the last year.  She is having baseline urinary symptoms of frequency (every two to three hours), nocturia (every hour), incontinence and intermittency.  Her PVR was 243 mL.    Her daughter states that her home health nurse took an urine sample  recently and the results are still pending Chavez the time of this visit.  An UA in 09/2015 was positive for [facility] and culture was negative.    Non contrast CT performed on 11/11/2016 noted that the patient does not have a solitary kidney. Rather, there is a normal sized left kidney and a severely atrophic right kidney. In the interpolar region of the right kidney there is a 2 mm calcification which may be vascular, or may represent a nonobstructive calculus. No ureteral stones or findings of urinary tract obstruction are noted Chavez this time.  Multiple large bladder wall diverticulae, measuring up to 3.7 cm on the right side.  Large hiatal hernia.  Aortic atherosclerosis, in addition to right coronary artery disease.  Colonic diverticulosis without evidence of acute diverticulitis Chavez this time.  Additional incidental findings, as above.  I have independently reviewed the films.  Since she has seen [place] in 09/2016, she was seen in the ED for a fall resulting in a ankle injury.  A urine culture was performed and she was noted to have a positive urine culture for Viridian streptococcus.  This was a catheterized specimen and patient was not initiated on an antibiotic Chavez that time.  Notes from the assisted living facility were reviewed and a urine culture was repeated and it was negative.  Today, she is complaining of urinary  frequency and nocturia.  She is not having dysuria, gross hematuria and suprapubic pain.  She is not experiencing fevers, chills, nausea and vomiting.  She is not allowed to be cathed Chavez this time as she is not released to stand on her foot.     PMH: Past Medical History:  Diagnosis Date  . Anxiety   . Cancer ([place])   . Coronary artery disease   . Diabetes mellitus   . Glaucoma   . Heartburn   . Hernia   . Hypercholesterolemia   . Hypertension   . Kidney failure   . UTI (urinary tract infection)     Surgical History: Past Surgical History:  Procedure Laterality Date  . ABDOMINAL  HYSTERECTOMY    . CATARACT EXTRACTION, BILATERAL Bilateral    2008  . CHOLECYSTECTOMY    . COLON SURGERY    . TOE SURGERY Right    1998    Home Medications:  Allergies as of 11/14/2016      Reactions   Cephalexin Other (See Comments)   Low back pain   Ciprofloxacin Other (See Comments)   Other Other (See Comments)   Skin rips   Penicillins Nausea And Vomiting   Sulfa Antibiotics Other (See Comments)   Sleeplessness, joint pain, cramps   Tape    Skin rips      Medication List       Accurate as of 11/14/16 10:09 AM. Always use your most recent med list.          acetaminophen 500 MG tablet Commonly known as:  TYLENOL Take 500 mg by mouth every 6 (six) hours as needed.   ALPRAZolam 0.25 MG tablet Commonly known as:  XANAX Take 0.25 mg by mouth daily as needed. For anxiety   Cholecalciferol 1000 units capsule Take 2,000 Units by mouth daily.   clopidogrel 75 MG tablet Commonly known as:  PLAVIX Take 75 mg by mouth daily.   Cranberry 500 MG Caps Take 500 mg by mouth daily.   cyanocobalamin 500 MCG tablet Take 500 mcg by mouth daily.   cyanocobalamin 1000 MCG/ML injection Commonly known as:  (VITAMIN B-12) Inject 1,000 mcg into the muscle every 4 (four) months.   esomeprazole 40 MG capsule Commonly known as:  NEXIUM Take 40 mg by mouth daily before breakfast.   ezetimibe-simvastatin 10-40 MG tablet Commonly known as:  VYTORIN Take 1 tablet by mouth Chavez bedtime.   ferrous sulfate 325 (65 FE) MG tablet Take 325 mg by mouth daily with breakfast.   fexofenadine 180 MG tablet Commonly known as:  ALLEGRA Take 180 mg by mouth daily as needed. For allergies   FIFTY50 GLUCOSE METER 2.0 w/Device Kit Use to test blood sugars once daily.  Dx code: E11.9   glyBURIDE 5 MG tablet Commonly known as:  DIABETA Take 5 mg by mouth 2 (two) times daily.   HYDROcodone-acetaminophen 5-325 MG tablet Commonly known as:  NORCO/VICODIN Take 1 tablet by mouth every 4 (four)  hours as needed for moderate pain.   oxyCODONE-acetaminophen 5-325 MG tablet Commonly known as:  ROXICET Take 1 tablet by mouth every 4 (four) hours as needed for severe pain.   vitamin C 500 MG tablet Commonly known as:  ASCORBIC ACID Take 500 mg by mouth daily.       Allergies:  Allergies  Allergen Reactions  . Cephalexin Other (See Comments)    Low back pain  . Ciprofloxacin Other (See Comments)  . Other Other (See Comments)  Skin rips  . Penicillins Nausea And Vomiting  . Sulfa Antibiotics Other (See Comments)    Sleeplessness, joint pain, cramps  . Tape     Skin rips    Family History: Family History  Problem Relation Age of Onset  . Heart disease Father   . Prostate cancer Neg Hx   . Kidney cancer Neg Hx   . Kidney disease Neg Hx   . Bladder Cancer Neg Hx     Social History:  reports that she has never smoked. She has never used smokeless tobacco. She reports that she does not drink alcohol or use drugs.  ROS: UROLOGY Frequent Urination?: Yes Hard to postpone urination?: No Burning/pain with urination?: No Get up Chavez night to urinate?: Yes Leakage of urine?: No Urine stream starts and stops?: No Trouble starting stream?: No Do you have to strain to urinate?: No Blood in urine?: No Urinary tract infection?: No Sexually transmitted disease?: No Injury to kidneys or bladder?: No Painful intercourse?: No Weak stream?: No Currently pregnant?: No Vaginal bleeding?: No Last menstrual period?: n  Gastrointestinal Nausea?: No Vomiting?: No Indigestion/heartburn?: No Diarrhea?: No Constipation?: No  Constitutional Fever: No Night sweats?: No Weight loss?: No Fatigue?: No  Skin Skin rash/lesions?: No Itching?: No  Eyes Blurred vision?: No Double vision?: No  Ears/Nose/Throat Sore throat?: No Sinus problems?: No  Hematologic/Lymphatic Swollen glands?: No Easy bruising?: No  Cardiovascular Leg swelling?: Yes Chest pain?:  No  Respiratory Cough?: No Shortness of breath?: No  Endocrine Excessive thirst?: No  Musculoskeletal Back pain?: No Joint pain?: No  Neurological Headaches?: No Dizziness?: No  Psychologic Depression?: No Anxiety?: No  Physical Exam: BP 105/65   Pulse 84   Ht 5' 7" (1.702 m)   Wt 158 lb (71.7 kg)   BMI 24.75 kg/m   Constitutional: Well nourished. Alert and oriented, No acute distress. HEENT: Amy Chavez, moist mucus membranes. Trachea midline, no masses. Cardiovascular: No clubbing, cyanosis, or edema. Respiratory: Normal respiratory effort, no increased work of breathing. Skin: No rashes, bruises or suspicious lesions. Lymph: No cervical or inguinal adenopathy. Neurologic: Grossly intact, no focal deficits, moving all 4 extremities. Psychiatric: Normal mood and affect.  Laboratory Data: Lab Results  Component Value Date   WBC 12.7 (H) 10/22/2016   HGB 9.9 (L) 10/22/2016   HCT 31.7 (L) 10/22/2016   MCV 81.6 10/22/2016   PLT 297 10/22/2016    Lab Results  Component Value Date   CREATININE 1.94 (H) 10/22/2016     Lab Results  Component Value Date   HGBA1C 6.4 (H) 09/23/2011    Lab Results  Component Value Date   TSH 3.560 06/27/2012       Component Value Date/Time   CHOL 123 09/23/2011 0440   HDL 62 09/23/2011 0440   CHOLHDL 2.0 09/23/2011 0440   VLDL 17 09/23/2011 0440   LDLCALC 44 09/23/2011 0440    Lab Results  Component Value Date   AST 13 (L) 10/22/2016   Lab Results  Component Value Date   ALT 10 (L) 10/22/2016     Pertinent Imaging: Results for Amy Chavez, Amy Chavez (MRN [id number]) as of 10/10/2016 10:24  Ref. Range 10/10/2016 09:31  Scan Result Unknown [id number]   CLINICAL DATA:  83 year old female with history of solitary kidney presenting with microscopic hematuria. History of chronic kidney disease. Recurrent urinary tract infections for several years. Difficulty holding urinary bladder when lying down flat.  EXAM: CT ABDOMEN AND  PELVIS WITHOUT CONTRAST  TECHNIQUE: Multidetector CT  imaging of the abdomen and pelvis was performed following the standard protocol without IV contrast.  COMPARISON:  No priors.  FINDINGS: Lower chest: Atherosclerotic calcifications in the right coronary artery. Calcifications of the mitral annulus. Large hiatal hernia containing much of the stomach. Aortic atherosclerosis.  Hepatobiliary: No definite cystic or solid hepatic lesions are identified on today's noncontrast CT examination. Status post cholecystectomy.  Pancreas: No pancreatic mass. No pancreatic ductal dilatation. No pancreatic or peripancreatic fluid or inflammatory changes.  Spleen: Unremarkable.  Adrenals/Urinary Tract: There is a right kidney, however, this is severely atrophic. Tiny 2 mm calcification in the interpolar region of the right kidney may be vascular, or could represent a nonobstructive calculi. No additional calcifications are identified within the left renal collecting system, along the course of either ureter, or within the lumen of the urinary bladder. No hydroureteronephrosis. Multiple large bladder diverticulae bilaterally, measuring up to 3.7 cm on the right side. Bilateral adrenal glands are normal in appearance.  Stomach/Bowel: Intraabdominal portion of the stomach is unremarkable. No pathologic dilatation of small bowel or colon. Numerous colonic diverticulae are noted, without surrounding inflammatory changes to suggest an acute diverticulitis Chavez this time. Postoperative changes of partial colectomy are noted in the region of the cecum.  Vascular/Lymphatic: Aortic atherosclerosis, without definite aneurysm in the abdominal or pelvic vasculature. No lymphadenopathy noted in the abdomen or pelvis.  Reproductive: Status post hysterectomy. Ovaries are not confidently identified may be surgically absent or atrophic.  Other: No significant volume of ascites.  No  pneumoperitoneum.  Musculoskeletal: There are no aggressive appearing lytic or blastic lesions noted in the visualized portions of the skeleton.  IMPRESSION: 1. The patient does not have a solitary kidney. Rather, there is a normal sized left kidney and a severely atrophic right kidney. In the interpolar region of the right kidney there is a 2 mm calcification which may be vascular, or may represent a nonobstructive calculus. No ureteral stones or findings of urinary tract obstruction are noted Chavez this time. 2. Multiple large bladder wall diverticulae, measuring up to 3.7 cm on the right side. 3. Large hiatal hernia. 4. Aortic atherosclerosis, in addition to right coronary artery disease. 5. Colonic diverticulosis without evidence of acute diverticulitis Chavez this time. 6. Additional incidental findings, as above.   Electronically Signed   By: Vinnie Langton M.D.   On: 11/11/2016 09:48   Assessment & Plan:    1. Bladder diverticulae  - encouraged to drink water  - encouraged to void every two hours while awake  - RTC in 3 weeks for CATH UA as patient is not allowed to stand on her foot Chavez this time  2. Frequency  - no ureteral or bladder stones seen on CT to explain frequency  - continue conservative management Chavez this time as she and her daughter are not willing to preform CIC  3. Nocturia  - no etiology for nocturia seen on CT to explain nocturia  - I explained to the patient that nocturia is often multi-factorial and difficult to treat.  Sleeping disorders, heart conditions, peripheral vascular disease, diabetes, an enlarged prostate for men, an urethral stricture causing bladder outlet obstruction and/or certain medications can contribute to nocturia.  - I have suggested that the patient avoid caffeine after noon and alcohol in the evening.  He or she may also benefit from fluid restrictions after 6:00 in the evening and voiding just prior to bedtime.  - I have  explained that research studies have showed that over  84% of patients with sleep apnea reported frequent nighttime urination.   With sleep apnea, oxygen decreases, carbon dioxide increases, the blood become more acidic, the heart rate drops and blood vessels in the lung constrict.  The body is then alerted that something is very wrong. The sleeper must wake enough to reopen the airway. By this time, the heart is racing and experiences a false signal of fluid overload. The heart excretes a hormone-like protein that tells the body to get rid of sodium and water, resulting in nocturia.  - There is also an increased incidence in sleep apnea with menopause, symptoms include night sweats, daytime sleepiness, depressed mood, and cognitive complaints like poor concentration or problems with short-term memory   - The patient may benefit from a discussion with his or her primary care physician to see if he or she has risk factors for sleep apnea or other sleep disturbances and obtaining a sleep study - will pursue when recovers from ankle fracture   4. Mixed incontinence  - bladder diverticulae found on CT  - continue conservative management with depends  5. Vaginal atrophy  - She is using the cream three nights weekly  - RTC in 3 months for exam  6. Cystocele  - will start patient on vaginal estrogen cream today  - RTC in 3 months for exam  7. Atrophic right kidney   - seen on CT  8. Microscopic hematuria  - no etiology for AHM seen on CT scan  - discussed cystoscopy with patient and daughter; will hold for now   Return in about 3 weeks (around 12/05/2016) for CATH UA and office visit.  These notes generated with voice recognition software. I apologize for typographical errors.  Amy Chavez, [facility] [address] [place], [place] [address] [phone]

## 2016-11-13 NOTE — Progress Notes (Signed)
Location:  [facility] Room Number: [id number] Place of Service:  SNF (31)  Provider: Marlowe Sax FNP-C   PCP: Tracie Harrier, MD Patient Care Team: Tracie Harrier, MD as PCP - General (Internal Medicine)  Extended Emergency Contact Information Primary Emergency Contact: Hanner,Helene Address: [address]          [place], [place] [address] [place] Phone: [phone] Relation: Daughter  Code Status: Full Code  Goals of care:  Advanced Directive information Advanced Directives 11/11/2016  Does Patient Have a Medical Advance Directive? No  Type of Advance Directive -  Would patient like information on creating a medical advance directive? -     Allergies  Allergen Reactions  . Cephalexin Other (See Comments)    Low back pain  . Ciprofloxacin Other (See Comments)  . Other Other (See Comments)    Skin rips  . Penicillins Nausea And Vomiting  . Sulfa Antibiotics Other (See Comments)    Sleeplessness, joint pain, cramps  . Tape     Skin rips    Chief Complaint  Patient presents with  . Discharge Note    HPI:  83 y.o. female seen today at [facility] for discharge home. She was here for short term rehabilitation post hospital visit 10/20/2016 post fall at home. She sustained a small Non Displaced Lateral Malleolar Fracture of Left ankle. She was splinted and send home. She also had injury to her Right knee With some swelling but no fracture. She was initially sent home with Home Care nurse but she came back to ED 10/22/2016 as it was hard for her daughter who recently had Heart surgery to take care of her. She was discharged to rehab. She has a medical history of HTN, Type 2 DM, CKD stage 3,GERD, CAD, Anxiety, Hyperlipidemia, Hx of colon cancer among other conditions. She is seen in her room today.She states right knee pain under control with current regimen.She denies any acute issues this visit.She has worked well  with PT/OT now stable for discharge home.She will be discharged home with Home health PT/OT to continue with ROM, Exercise, Gait stability and muscle strengthening.She will also need a HH Aid to assist with ADL's. She will require DME a transport wheelchair to allow her to maintain current level of independence with ADL's.Patient's daughter recently had Heart surgery prefers light weight WC unable to push regular wheelchair.She will also require a 3-1 bedside commode to safely and independently perform toileting transfer in home due to unsteady gait. Home health services will be arranged by facility social worker prior to discharge.Prescription medication will be written x 1 month then patient to follow up with PCP in 1-2 weeks.Facility staff report no new concerns.   Past Medical History:  Diagnosis Date  . Anxiety   . Cancer ([place])   . Coronary artery disease   . Diabetes mellitus   . Glaucoma   . Heartburn   . Hernia   . Hypercholesterolemia   . Hypertension   . Kidney failure   . UTI (urinary tract infection)     Past Surgical History:  Procedure Laterality Date  . ABDOMINAL HYSTERECTOMY    . CATARACT EXTRACTION, BILATERAL Bilateral    2008  . CHOLECYSTECTOMY    . COLON SURGERY    . TOE SURGERY Right    1998      reports that she has never smoked. She has never used smokeless tobacco. She reports that she does  not drink alcohol or use drugs. Social History   Social History  . Marital status: Widowed    Spouse name: N/A  . Number of children: N/A  . Years of education: N/A   Occupational History  . Not on file.   Social History Main Topics  . Smoking status: Never Smoker  . Smokeless tobacco: Never Used  . Alcohol use No  . Drug use: No  . Sexual activity: No     Comment: Lives in [place] with daughter and grandchildren. Able to do IADLs   Other Topics Concern  . Not on file   Social History Narrative  . No narrative on file    Allergies  Allergen  Reactions  . Cephalexin Other (See Comments)    Low back pain  . Ciprofloxacin Other (See Comments)  . Other Other (See Comments)    Skin rips  . Penicillins Nausea And Vomiting  . Sulfa Antibiotics Other (See Comments)    Sleeplessness, joint pain, cramps  . Tape     Skin rips    Pertinent  Health Maintenance Due  Topic Date Due  . FOOT EXAM  10/18/1935  . OPHTHALMOLOGY EXAM  10/18/1935  . URINE MICROALBUMIN  10/18/1935  . DEXA SCAN  10/18/1992  . PNA vac Low Risk Adult (1 of 2 - PCV13) 10/18/1992  . HEMOGLOBIN A1C  03/22/2012  . INFLUENZA VACCINE  04/12/2016    Medications: Allergies as of 11/11/2016      Reactions   Cephalexin Other (See Comments)   Low back pain   Ciprofloxacin Other (See Comments)   Other Other (See Comments)   Skin rips   Penicillins Nausea And Vomiting   Sulfa Antibiotics Other (See Comments)   Sleeplessness, joint pain, cramps   Tape    Skin rips      Medication List       Accurate as of 11/11/16 11:59 PM. Always use your most recent med list.          acetaminophen 500 MG tablet Commonly known as:  TYLENOL Take 500 mg by mouth every 6 (six) hours as needed.   ALPRAZolam 0.25 MG tablet Commonly known as:  XANAX Take 0.25 mg by mouth daily as needed. For anxiety   Cholecalciferol 1000 units capsule Take 2,000 Units by mouth daily.   clopidogrel 75 MG tablet Commonly known as:  PLAVIX Take 75 mg by mouth daily.   Cranberry 500 MG Caps Take 500 mg by mouth daily.   cyanocobalamin 500 MCG tablet Take 500 mcg by mouth daily.   cyanocobalamin 1000 MCG/ML injection Commonly known as:  (VITAMIN B-12) Inject 1,000 mcg into the muscle every 4 (four) months.   esomeprazole 40 MG capsule Commonly known as:  NEXIUM Take 40 mg by mouth daily before breakfast.   ezetimibe-simvastatin 10-40 MG tablet Commonly known as:  VYTORIN Take 1 tablet by mouth at bedtime.   ferrous sulfate 325 (65 FE) MG tablet Take 325 mg by mouth daily  with breakfast.   fexofenadine 180 MG tablet Commonly known as:  ALLEGRA Take 180 mg by mouth daily as needed. For allergies   FIFTY50 GLUCOSE METER 2.0 w/Device Kit Use to test blood sugars once daily.  Dx code: E11.9   glyBURIDE 5 MG tablet Commonly known as:  DIABETA Take 5 mg by mouth 2 (two) times daily.   HYDROcodone-acetaminophen 5-325 MG tablet Commonly known as:  NORCO/VICODIN Take 1 tablet by mouth every 4 (four) hours as needed for moderate pain.  oxyCODONE-acetaminophen 5-325 MG tablet Commonly known as:  ROXICET Take 1 tablet by mouth every 4 (four) hours as needed for severe pain.   vitamin C 500 MG tablet Commonly known as:  ASCORBIC ACID Take 500 mg by mouth daily.       Review of Systems  Constitutional: Negative for activity change, appetite change, chills, fatigue and fever.  HENT: Negative for congestion, rhinorrhea, sinus pain, sinus pressure, sneezing and sore throat.   Eyes: Negative.   Respiratory: Negative for cough, chest tightness, shortness of breath and wheezing.   Cardiovascular: Negative for chest pain, palpitations and leg swelling.  Gastrointestinal: Negative for abdominal distention, abdominal pain, constipation, diarrhea, nausea and vomiting.  Endocrine: Negative for cold intolerance, heat intolerance, polydipsia, polyphagia and polyuria.  Genitourinary: Negative for dysuria, flank pain, frequency and urgency.  Musculoskeletal: Positive for gait problem.       Right knee pain under control with pain medications  Skin: Negative for color change, pallor and rash.  Neurological: Negative for dizziness, seizures, syncope, light-headedness and headaches.  Hematological: Does not bruise/bleed easily.  Psychiatric/Behavioral: Negative for agitation, confusion, hallucinations and sleep disturbance. The patient is not nervous/anxious.     Vitals:   11/11/16 1053  BP: 133/69  Pulse: 67  Resp: 16  Temp: (!) 96.7 F (35.9 C)  SpO2: 96%    Weight: 168 lb 11.2 oz (76.5 kg)  Height: 5' 7"  (1.702 m)   Body mass index is 26.42 kg/m. Physical Exam  Constitutional: She is oriented to person, place, and time. She appears well-developed and well-nourished. No distress.  HENT:  Head: Normocephalic.  Mouth/Throat: Oropharynx is clear and moist. No oropharyngeal exudate.  Eyes: Conjunctivae and EOM are normal. Pupils are equal, round, and reactive to light. Right eye exhibits no discharge. Left eye exhibits no discharge. No scleral icterus.  Neck: Normal range of motion. No JVD present. No thyromegaly present.  Cardiovascular: Normal rate, regular rhythm, normal heart sounds and intact distal pulses.  Exam reveals no gallop and no friction rub.   No murmur heard. Pulmonary/Chest: Effort normal and breath sounds normal. No respiratory distress. She has no wheezes. She has no rales.  Abdominal: Soft. Bowel sounds are normal. She exhibits no distension. There is no tenderness. There is no rebound and no guarding.  Genitourinary:  Genitourinary Comments: Incontinent   Musculoskeletal: She exhibits no edema.  Moves x 4 extremities.Unsteady gait.  Lymphadenopathy:    She has no cervical adenopathy.  Neurological: She is oriented to person, place, and time.  Skin: Skin is warm and dry. No rash noted. No erythema. No pallor.  Psychiatric: She has a normal mood and affect.    Labs reviewed: Basic Metabolic Panel:  Recent Labs  09/17/16 1611 09/21/16 1055 10/22/16 1151  NA 131* 138 131*  K 4.7 4.7 5.7*  CL 101 109 103  CO2 22 21* 21*  GLUCOSE 224* 144* 232*  BUN 26* 39* 34*  CREATININE 1.53* 1.92* 1.94*  CALCIUM 9.0 9.4 9.0   Liver Function Tests:  Recent Labs  09/17/16 1611 09/21/16 1055 10/22/16 1151  AST 33 33 13*  ALT 26 28 10*  ALKPHOS 94 100 90  BILITOT 0.8 0.6 0.6  PROT 7.4 7.8 7.2  ALBUMIN 3.5 3.9 3.4*    Recent Labs  09/17/16 1611  LIPASE 12   CBC:  Recent Labs  09/17/16 1611 09/21/16 1055  10/22/16 1151  WBC 10.4 6.2 12.7*  NEUTROABS 9.6*  --  10.2*  HGB 11.4* 11.3* 9.9*  HCT 35.5 34.8* 31.7*  MCV 83.5 83.2 81.6  PLT 201 285 297    Procedures and Imaging Studies During Stay: Ct Abdomen Pelvis Wo Contrast  Result Date: 11/11/2016 CLINICAL DATA:  83 year old female with history of solitary kidney presenting with microscopic hematuria. History of chronic kidney disease. Recurrent urinary tract infections for several years. Difficulty holding urinary bladder when lying down flat. EXAM: CT ABDOMEN AND PELVIS WITHOUT CONTRAST TECHNIQUE: Multidetector CT imaging of the abdomen and pelvis was performed following the standard protocol without IV contrast. COMPARISON:  No priors. FINDINGS: Lower chest: Atherosclerotic calcifications in the right coronary artery. Calcifications of the mitral annulus. Large hiatal hernia containing much of the stomach. Aortic atherosclerosis. Hepatobiliary: No definite cystic or solid hepatic lesions are identified on today's noncontrast CT examination. Status post cholecystectomy. Pancreas: No pancreatic mass. No pancreatic ductal dilatation. No pancreatic or peripancreatic fluid or inflammatory changes. Spleen: Unremarkable. Adrenals/Urinary Tract: There is a right kidney, however, this is severely atrophic. Tiny 2 mm calcification in the interpolar region of the right kidney may be vascular, or could represent a nonobstructive calculi. No additional calcifications are identified within the left renal collecting system, along the course of either ureter, or within the lumen of the urinary bladder. No hydroureteronephrosis. Multiple large bladder diverticulae bilaterally, measuring up to 3.7 cm on the right side. Bilateral adrenal glands are normal in appearance. Stomach/Bowel: Intraabdominal portion of the stomach is unremarkable. No pathologic dilatation of small bowel or colon. Numerous colonic diverticulae are noted, without surrounding inflammatory changes to  suggest an acute diverticulitis at this time. Postoperative changes of partial colectomy are noted in the region of the cecum. Vascular/Lymphatic: Aortic atherosclerosis, without definite aneurysm in the abdominal or pelvic vasculature. No lymphadenopathy noted in the abdomen or pelvis. Reproductive: Status post hysterectomy. Ovaries are not confidently identified may be surgically absent or atrophic. Other: No significant volume of ascites.  No pneumoperitoneum. Musculoskeletal: There are no aggressive appearing lytic or blastic lesions noted in the visualized portions of the skeleton. IMPRESSION: 1. The patient does not have a solitary kidney. Rather, there is a normal sized left kidney and a severely atrophic right kidney. In the interpolar region of the right kidney there is a 2 mm calcification which may be vascular, or may represent a nonobstructive calculus. No ureteral stones or findings of urinary tract obstruction are noted at this time. 2. Multiple large bladder wall diverticulae, measuring up to 3.7 cm on the right side. 3. Large hiatal hernia. 4. Aortic atherosclerosis, in addition to right coronary artery disease. 5. Colonic diverticulosis without evidence of acute diverticulitis at this time. 6. Additional incidental findings, as above. Electronically Signed   By: Vinnie Langton M.D.   On: 11/11/2016 09:48   Dg Ankle Complete Left  Result Date: 10/20/2016 CLINICAL DATA:  Fall today.  Left ankle pain. EXAM: LEFT ANKLE COMPLETE - 3+ VIEW COMPARISON:  None. FINDINGS: Diffuse soft tissue swelling, most pronounced laterally. Lucency at the tip of the lateral malleolus compatible with nondisplaced fracture. No tibial abnormality. IMPRESSION: Nondisplaced fracture through the tip of the lateral malleolus. Overlying soft tissue swelling. Electronically Signed   By: Rolm Baptise M.D.   On: 10/20/2016 15:10   Dg Ankle Complete Right  Result Date: 10/20/2016 CLINICAL DATA:  Fall, pain EXAM: RIGHT ANKLE -  COMPLETE 3+ VIEW COMPARISON:  None. FINDINGS: No acute bony abnormality. Specifically, no fracture, subluxation, or dislocation. Soft tissues are intact. IMPRESSION: No acute bony abnormality. Electronically Signed   By: Lennette Bihari  Dover M.D.   On: 10/20/2016 15:10   Ct Head Wo Contrast  Result Date: 10/20/2016 CLINICAL DATA:  The patient tripped and fell over an ottoman today. EXAM: CT HEAD WITHOUT CONTRAST TECHNIQUE: Contiguous axial images were obtained from the base of the skull through the vertex without intravenous contrast. COMPARISON:  None. FINDINGS: Brain: No acute abnormality including hemorrhage, infarct, mass lesion, mass effect, midline shift or abnormal extra-axial fluid collection. No hydrocephalus or pneumocephalus. Vascular: Atherosclerosis noted. Skull: Intact. Sinuses/Orbits: Mucosal thickening and air-fluid levels are seen in the maxillary sinuses bilaterally. There is scattered ethmoid air cell disease and mild mucosal thickening in the right sphenoid sinus. Other: None. IMPRESSION: No acute intracranial abnormality. Findings consistent with acute bilateral maxillary sinusitis. Scattered ethmoid air cell disease and mild mucosal thickening right sphenoid sinus also noted. Atherosclerosis. Electronically Signed   By: Inge Rise M.D.   On: 10/20/2016 14:53   Ct Knee Right Wo Contrast  Result Date: 10/22/2016 CLINICAL DATA:  Pt here c/o worsening rt knee pain from her fall on Thursday, pt states that she was seen in the ER and treated for a fractured left ankle, pt states that she is having such pain in the rt knee she can not get up. EXAM: CT OF THE RIGHT KNEE WITHOUT CONTRAST TECHNIQUE: Multidetector CT imaging of the RIGHT knee was performed according to the standard protocol. Multiplanar CT image reconstructions were also generated. COMPARISON:  None. FINDINGS: Bones/Joint/Cartilage Generalized osteopenia. No fracture or dislocation. Normal alignment. No joint effusion. Medial scratch  them mild medial femorotibial compartment joint space narrowing with subchondral sclerosis and marginal osteophytosis consistent with osteoarthritis. Mild patellofemoral compartment osteoarthritis. Chondrocalcinosis of the medial and lateral femorotibial compartments as can be seen with CPPD. Large joint effusion. Ligaments Ligaments are suboptimally evaluated by CT. Muscles and Tendons Muscles are normal. No muscle atrophy. Intact quadriceps tendon and patellar tendon. Soft tissue No fluid collection or hematoma. No soft tissue mass. Peripheral vascular atherosclerotic disease. IMPRESSION: 1.  No acute osseous injury of the right knee. 2. Large joint effusion of the right knee. 3. Mild osteoarthritis of the medial femorotibial compartment. Electronically Signed   By: Kathreen Devoid   On: 10/22/2016 12:38   Dg Knee Complete 4 Views Right  Result Date: 10/20/2016 CLINICAL DATA:  Fall today.  Pain, bruising. EXAM: RIGHT KNEE - COMPLETE 4+ VIEW COMPARISON:  None. FINDINGS: Mild chondrocalcinosis. Joint space narrowing and spurring, most pronounced in the medial and patellofemoral compartments. No acute bony abnormality. Specifically, no fracture, subluxation, or dislocation. Soft tissues are intact. No joint effusion. IMPRESSION: Mild degenerative changes and chondrocalcinosis. No acute bony abnormality. Electronically Signed   By: Rolm Baptise M.D.   On: 10/20/2016 15:11    Assessment/Plan:   1. Unsteady gait Has worked well with PT/ OT. Will discharge home PT/OT to continue with ROM, Exercise, Gait stability and muscle strengthening. She will require DME Transport wheelchair to allow her to maintain current level of independence with ADL's.Patient's daughter recently had Heart surgery prefers light weight WC unable to push regular wheelchair.A 3-1 bedside commode to safely and independently perform toileting transfer in home due to unsteady gait. Fall and safety precautions.   2. Generalized muscle  weakness Has improved with Therapy.Will discharge with PT/OT to continue with muscle strengthening.  3. Benign hypertension with CKD (chronic kidney disease) stage III B/p stable.continue to monitor. BMP in 1-2 weeks with PCP.    4. Type 2 diabetes mellitus with stage 3 chronic kidney disease, without  long-term current use of insulin CBG 115-200's.Recent Hgb A1C 8.5 ( 11/02/2016). Continue Glyburide. Continue to monitor.  5. Coronary artery disease Chest pain free. Continue on Plavix. On Vytorin. Continue to control high risk factors.   6. CKD (chronic kidney disease), stage III CR at baseline. BMP in 1-2 weeks with PCP  7. Mixed hyperlipidemia Continue on simvastatin. Monitor lipid panel.   8. Generalized anxiety disorder Stable. Continue on alprazolam as needed. Consider weaning alprazolam off due to high risk for falls.    Patient is being discharged with the following home health services:  -  PT/OT to continue with ROM, Exercise, Gait stability and muscle strengthening.  -  HH Aid to assist with ADL's.   Patient is being discharged with the following durable medical equipment:   -   Transport wheelchair to allow her to maintain current level of independence with ADL's.Patient's daughter recently had Heart surgery prefers light weight WC unable to push regular wheelchair.  - A 3-1 bedside commode to safely and independently perform toileting transfer in home due to unsteady gait.  Patient has been advised to f/u with their PCP in 1-2 weeks to for a transitions of care visit.Social services at their facility was responsible for arranging this appointment.Pt was provided with adequate prescriptions of noncontrolled medications to reach the scheduled appointment .For controlled substances, a limited supply was provided as appropriate for the individual patient. If the pt normally receives these medications from a pain clinic or has a contract with another physician, these medications  should be received from that clinic or physician only).    Future labs/tests needed: CBC, BMP in 1-2 weeks with PCP

## 2016-11-14 ENCOUNTER — Ambulatory Visit (INDEPENDENT_AMBULATORY_CARE_PROVIDER_SITE_OTHER): Payer: Medicare Other | Admitting: Urology

## 2016-11-14 ENCOUNTER — Encounter: Payer: Self-pay | Admitting: Urology

## 2016-11-14 VITALS — BP 105/65 | HR 84 | Ht 67.0 in | Wt 158.0 lb

## 2016-11-14 DIAGNOSIS — N952 Postmenopausal atrophic vaginitis: Secondary | ICD-10-CM

## 2016-11-14 DIAGNOSIS — N8111 Cystocele, midline: Secondary | ICD-10-CM

## 2016-11-14 DIAGNOSIS — N261 Atrophy of kidney (terminal): Secondary | ICD-10-CM

## 2016-11-14 DIAGNOSIS — N323 Diverticulum of bladder: Secondary | ICD-10-CM | POA: Diagnosis not present

## 2016-11-14 DIAGNOSIS — N3946 Mixed incontinence: Secondary | ICD-10-CM | POA: Diagnosis not present

## 2016-11-14 DIAGNOSIS — R3129 Other microscopic hematuria: Secondary | ICD-10-CM

## 2016-11-14 DIAGNOSIS — R351 Nocturia: Secondary | ICD-10-CM

## 2016-11-14 DIAGNOSIS — R350 Frequency of micturition: Secondary | ICD-10-CM

## 2016-11-14 NOTE — Addendum Note (Signed)
Addended by: Orlene Erm on: 11/14/2016 10:34 AM   Modules accepted: Orders

## 2016-12-05 ENCOUNTER — Ambulatory Visit: Payer: Medicare Other | Admitting: Urology

## 2016-12-31 NOTE — Progress Notes (Signed)
01/02/2017 9:40 AM   [name] 1926-04-08 [id number]  Referring provider: Tracie Harrier, MD [address] [facility] [place], [place] [address]  Chief Complaint  Patient presents with  . Follow-up    3 week bladder diverticulum    HPI: 83 yo WF with CKD, bladder diverticulum, frequency, nocturia, mixed incontinence, vaginal atrophy, cystocele, atrophic right kidney and microscopic hematuria who presents today for a 3 weeks follow up.    Bladder diverticulum Multiple large bladder wall diverticulae, measuring up to 3.7 cm on the right side seen on 11/11/2016 CTU.   Drinking three 16 ounces cups of water at day.  Is not engaging in timed voiding. PVR was 410 mL - but not a true PVR as patient had not voiding  Frequency Patient is visiting the restroom x 5-6 daily.    Nocturia She is having to get up four times nightly.   Mixed incontinence Patient with large residuals.  She using 6 POISE pads daily.  Worsened with higher BS.    Vaginal atrophy Patient applying cream three nights weekly.   Cystocele Grade I cystocele.    Atrophic right kidney Cr 1.4 in 12/2016  Microscopic hematuria CTU completed on 11/11/2016 with findings of a bladder wall diverticulae and a right atrophic kidney.  Cystoscopy not completed.     PMH: Past Medical History:  Diagnosis Date  . Anxiety   . Cancer ([place])   . Coronary artery disease   . Diabetes mellitus   . Glaucoma   . Heartburn   . Hernia   . Hypercholesterolemia   . Hypertension   . Kidney failure   . UTI (urinary tract infection)     Surgical History: Past Surgical History:  Procedure Laterality Date  . ABDOMINAL HYSTERECTOMY    . CATARACT EXTRACTION, BILATERAL Bilateral    2008  . CHOLECYSTECTOMY    . COLON SURGERY    . TOE SURGERY Right    1998    Home Medications:  Allergies as of 01/02/2017      Reactions   Cephalexin Other (See Comments)   Low back pain   Ciprofloxacin Other (See  Comments)   Other Other (See Comments)   Skin rips   Penicillins Nausea And Vomiting   Sulfa Antibiotics Other (See Comments)   Sleeplessness, joint pain, cramps   Tape    Skin rips      Medication List       Accurate as of 01/02/17  9:40 AM. Always use your most recent med list.          acetaminophen 500 MG tablet Commonly known as:  TYLENOL Take 500 mg by mouth every 6 (six) hours as needed.   ALPRAZolam 0.25 MG tablet Commonly known as:  XANAX Take 0.25 mg by mouth daily as needed. For anxiety   Cholecalciferol 1000 units capsule Take 2,000 Units by mouth daily.   clopidogrel 75 MG tablet Commonly known as:  PLAVIX Take 75 mg by mouth daily.   conjugated estrogens vaginal cream Commonly known as:  PREMARIN Place 1 Applicatorful vaginally 3 (three) times a week.   Cranberry 500 MG Caps Take 500 mg by mouth daily.   cyanocobalamin 500 MCG tablet Take 500 mcg by mouth daily.   cyanocobalamin 1000 MCG/ML injection Commonly known as:  (VITAMIN B-12) Inject 1,000 mcg into the muscle every 4 (four) months.   esomeprazole 40 MG capsule Commonly known as:  NEXIUM Take 40 mg by mouth daily before breakfast.  ezetimibe-simvastatin 10-40 MG tablet Commonly known as:  VYTORIN Take 1 tablet by mouth at bedtime.   ferrous sulfate 325 (65 FE) MG tablet Take 325 mg by mouth daily with breakfast.   fexofenadine 180 MG tablet Commonly known as:  ALLEGRA Take 180 mg by mouth daily as needed. For allergies   FIFTY50 GLUCOSE METER 2.0 w/Device Kit Use to test blood sugars once daily.  Dx code: E11.9   glyBURIDE 5 MG tablet Commonly known as:  DIABETA Take 7.5 mg by mouth 2 (two) times daily.   HYDROcodone-acetaminophen 5-325 MG tablet Commonly known as:  NORCO/VICODIN Take 1 tablet by mouth every 4 (four) hours as needed for moderate pain.   oxyCODONE-acetaminophen 5-325 MG tablet Commonly known as:  ROXICET Take 1 tablet by mouth every 4 (four) hours as  needed for severe pain.   vitamin C 500 MG tablet Commonly known as:  ASCORBIC ACID Take 500 mg by mouth daily.       Allergies:  Allergies  Allergen Reactions  . Cephalexin Other (See Comments)    Low back pain  . Ciprofloxacin Other (See Comments)  . Other Other (See Comments)    Skin rips  . Penicillins Nausea And Vomiting  . Sulfa Antibiotics Other (See Comments)    Sleeplessness, joint pain, cramps  . Tape     Skin rips    Family History: Family History  Problem Relation Age of Onset  . Heart disease Father   . Prostate cancer Neg Hx   . Kidney cancer Neg Hx   . Kidney disease Neg Hx   . Bladder Cancer Neg Hx     Social History:  reports that she has never smoked. She has never used smokeless tobacco. She reports that she does not drink alcohol or use drugs.  ROS: UROLOGY Frequent Urination?: No Hard to postpone urination?: Yes Burning/pain with urination?: No Get up at night to urinate?: Yes Leakage of urine?: Yes Urine stream starts and stops?: Yes Trouble starting stream?: No Do you have to strain to urinate?: No Blood in urine?: No Urinary tract infection?: Yes Sexually transmitted disease?: No Injury to kidneys or bladder?: No Painful intercourse?: No Weak stream?: No Currently pregnant?: No Vaginal bleeding?: No Last menstrual period?: n  Gastrointestinal Nausea?: No Vomiting?: No Indigestion/heartburn?: No Diarrhea?: No Constipation?: No  Constitutional Fever: No Night sweats?: No Weight loss?: No Fatigue?: No  Skin Skin rash/lesions?: No Itching?: No  Eyes Blurred vision?: Yes Double vision?: No  Ears/Nose/Throat Sore throat?: No Sinus problems?: Yes  Hematologic/Lymphatic Swollen glands?: No Easy bruising?: No  Cardiovascular Leg swelling?: No Chest pain?: No  Respiratory Cough?: No Shortness of breath?: Yes  Endocrine Excessive thirst?: No  Musculoskeletal Back pain?: Yes Joint pain?:  No  Neurological Headaches?: No Dizziness?: No  Psychologic Depression?: No Anxiety?: Yes  Physical Exam: BP 122/69   Pulse 78   Ht _0  (1.727 m)   Wt 167 lb 14.4 oz (76.2 kg)   BMI 25.53 kg/m   Constitutional: Well nourished. Alert and oriented, No acute distress. HEENT: [place] AT, moist mucus membranes. Trachea midline, no masses. Cardiovascular: No clubbing, cyanosis, or edema. Respiratory: Normal respiratory effort, no increased work of breathing. Skin: No rashes, bruises or suspicious lesions. Lymph: No cervical or inguinal adenopathy. Neurologic: Grossly intact, no focal deficits, moving all 4 extremities. Psychiatric: Normal mood and affect.  Laboratory Data: Lab Results  Component Value Date   WBC 12.7 (H) 10/22/2016   HGB 9.9 (L) 10/22/2016  HCT 31.7 (L) 10/22/2016   MCV 81.6 10/22/2016   PLT 297 10/22/2016    Lab Results  Component Value Date   CREATININE 1.94 (H) 10/22/2016     Lab Results  Component Value Date   HGBA1C 6.4 (H) 09/23/2011    Lab Results  Component Value Date   TSH 3.560 06/27/2012       Component Value Date/Time   CHOL 123 09/23/2011 0440   HDL 62 09/23/2011 0440   CHOLHDL 2.0 09/23/2011 0440   VLDL 17 09/23/2011 0440   LDLCALC 44 09/23/2011 0440    Lab Results  Component Value Date   AST 13 (L) 10/22/2016   Lab Results  Component Value Date   ALT 10 (L) 10/22/2016    Assessment & Plan:    1. Bladder diverticulae  - encouraged to drink water  - encouraged to void every two hours while awake  - RTC in three months for OAB questionnaire and PVR  2. Frequency  - no ureteral or bladder stones seen on CT to explain frequency  - DM under better control now that she is out of rehabilitation therapy  - continue conservative management at this time as she and her daughter are not willing to preform CIC  3. Nocturia  - no etiology for nocturia seen on CT to explain nocturia  - I explained to the patient that  nocturia is often multi-factorial and difficult to treat.  Sleeping disorders, heart conditions, peripheral vascular disease, diabetes, an enlarged prostate for men, an urethral stricture causing bladder outlet obstruction and/or certain medications can contribute to nocturia.  - I have suggested that the patient avoid caffeine after noon and alcohol in the evening.  He or she may also benefit from fluid restrictions after 6:00 in the evening and voiding just prior to bedtime.  - I have explained that research studies have showed that over 84% of patients with sleep apnea reported frequent nighttime urination.   With sleep apnea, oxygen decreases, carbon dioxide increases, the blood become more acidic, the heart rate drops and blood vessels in the lung constrict.  The body is then alerted that something is very wrong. The sleeper must wake enough to reopen the airway. By this time, the heart is racing and experiences a false signal of fluid overload. The heart excretes a hormone-like protein that tells the body to get rid of sodium and water, resulting in nocturia.  - There is also an increased incidence in sleep apnea with menopause, symptoms include night sweats, daytime sleepiness, depressed mood, and cognitive complaints like poor concentration or problems with short-term memory   - The patient may benefit from a discussion with his or her primary care physician to see if he or she has risk factors for sleep apnea or other sleep disturbances and obtaining a sleep study - will pursue when recovers from ankle fracture  - worsened with elevated BS at night  4. Mixed incontinence  - bladder diverticulae found on CT  - moderate residuals  - continue conservative management with depends  5. Vaginal atrophy  - She is using the cream three nights weekly  - RTC in 3 months for exam  6. Cystocele  - Grade I - continue vaginal estrogen cream today  - RTC in 3 months for exam  7. Atrophic right kidney    - seen on CT  - recent creatinine is 1.4 on 12/2016  8. Microscopic hematuria  - no etiology for AHM seen on CT  scan  - no AMH on today's UA   - discussed cystoscopy with patient and daughter; will hold for now   Return in about 2 months (around 03/04/2017) for OAB questionnaire, PVR and exam.  These notes generated with voice recognition software. I apologize for typographical errors.  Zara Council, [facility] [address] [place], [place] [address] [phone]

## 2017-01-02 ENCOUNTER — Encounter: Payer: Self-pay | Admitting: Urology

## 2017-01-02 ENCOUNTER — Ambulatory Visit (INDEPENDENT_AMBULATORY_CARE_PROVIDER_SITE_OTHER): Payer: Medicare Other | Admitting: Urology

## 2017-01-02 VITALS — BP 122/69 | HR 78 | Ht 68.0 in | Wt 167.9 lb

## 2017-01-02 DIAGNOSIS — N3946 Mixed incontinence: Secondary | ICD-10-CM

## 2017-01-02 DIAGNOSIS — N261 Atrophy of kidney (terminal): Secondary | ICD-10-CM

## 2017-01-02 DIAGNOSIS — N952 Postmenopausal atrophic vaginitis: Secondary | ICD-10-CM

## 2017-01-02 DIAGNOSIS — R350 Frequency of micturition: Secondary | ICD-10-CM

## 2017-01-02 DIAGNOSIS — R3129 Other microscopic hematuria: Secondary | ICD-10-CM | POA: Diagnosis not present

## 2017-01-02 DIAGNOSIS — R351 Nocturia: Secondary | ICD-10-CM | POA: Diagnosis not present

## 2017-01-02 DIAGNOSIS — N323 Diverticulum of bladder: Secondary | ICD-10-CM | POA: Diagnosis not present

## 2017-01-02 DIAGNOSIS — N8111 Cystocele, midline: Secondary | ICD-10-CM

## 2017-01-02 LAB — URINALYSIS, COMPLETE
Bilirubin, UA: NEGATIVE
Glucose, UA: NEGATIVE
Ketones, UA: NEGATIVE
NITRITE UA: NEGATIVE
PH UA: 5 (ref 5.0–7.5)
Specific Gravity, UA: 1.02 (ref 1.005–1.030)
Urobilinogen, Ur: 0.2 mg/dL (ref 0.2–1.0)

## 2017-01-02 LAB — MICROSCOPIC EXAMINATION: RBC MICROSCOPIC, UA: NONE SEEN /HPF (ref 0–?)

## 2017-01-02 NOTE — Progress Notes (Signed)
In and Out Catheterization  Patient is present today for a I & O catheterization due to bladder diverticulum. Patient was cleaned and prepped in a sterile fashion with betadine and Lidocaine 2% jelly was instilled into the urethra.  A 14FR cath was inserted no complications were noted , 420ml of urine return was noted, urine was yellow in color. A clean urine sample was collected for urinalysis. Bladder was drained and catheter was removed with out difficulty.    Preformed by: Lyndee Hensen CMA

## 2017-01-05 ENCOUNTER — Encounter: Payer: Self-pay | Admitting: Physical Therapy

## 2017-01-05 ENCOUNTER — Ambulatory Visit: Payer: Medicare Other | Attending: Internal Medicine | Admitting: Physical Therapy

## 2017-01-05 DIAGNOSIS — R262 Difficulty in walking, not elsewhere classified: Secondary | ICD-10-CM | POA: Diagnosis present

## 2017-01-05 DIAGNOSIS — M25672 Stiffness of left ankle, not elsewhere classified: Secondary | ICD-10-CM | POA: Insufficient documentation

## 2017-01-05 DIAGNOSIS — M6281 Muscle weakness (generalized): Secondary | ICD-10-CM | POA: Diagnosis present

## 2017-01-05 DIAGNOSIS — M25572 Pain in left ankle and joints of left foot: Secondary | ICD-10-CM | POA: Diagnosis present

## 2017-01-05 NOTE — Patient Instructions (Addendum)
FLEXION: Sitting - Resistance Band (Active)   Sit with right foot flat. Have band around left foot with right foot on top, bend left foot up, bringing toes toward head. Complete __2_ sets of __10_ repetitions. Perform _2__ sessions per day.    Eversion: Resisted    With right foot in tubing loop, hold tubing around other foot to resist and turn foot out. Repeat _10___ times per set. Do _2___ sets per session. Do __2__ sessions per day.  http://orth.exer.us/15   Copyright  VHI. All rights reserved.  Plantar Flexion: Resisted    Anchor behind, tubing around left foot, press down. Repeat __10__ times per set. Do _2___ sets per session. Do __2__ sessions per day.  http://orth.exer.us/11   Copyright  VHI. All rights reserved.  Ankle Alphabet    Using left ankle and foot only, trace the letters of the alphabet. Perform A to Z. Repeat _1-2___ times per set. Do _2___ sets per session. Do __2__ sessions per day.  http://orth.exer.us/17

## 2017-01-05 NOTE — Therapy (Signed)
[place] MAIN [facility] SERVICES [address] [place], [place], [address] Phone: [phone]   Fax:  [phone]  Physical Therapy Evaluation  Patient Details  Name: Amy Chavez MRN: [id number] Date of Birth: 06-27-1926 Referring Provider: Dr. Ramonita Lab  Encounter Date: 01/05/2017      PT End of Session - 01/05/17 1918    Visit Number 1   Number of Visits 17   Date for PT Re-Evaluation 03/02/17   Authorization Type gcode 1   Authorization Time Period 10   PT Start Time 1032   PT Stop Time 1128   PT Time Calculation (min) 56 min   Equipment Utilized During Treatment Gait belt   Activity Tolerance Patient tolerated treatment well;No increased pain   Behavior During Therapy WFL for tasks assessed/performed      Past Medical History:  Diagnosis Date  . Anxiety    controlled with medication;   . Cancer ([place])   . Coronary artery disease   . Diabetes mellitus    controlled  . Glaucoma   . Heartburn   . Hernia   . Hypercholesterolemia   . Hypertension    controlled with medication;   . Kidney failure   . UTI (urinary tract infection)     Past Surgical History:  Procedure Laterality Date  . ABDOMINAL HYSTERECTOMY    . CATARACT EXTRACTION, BILATERAL Bilateral    2008  . CHOLECYSTECTOMY    . COLON SURGERY    . TOE SURGERY Right    1998    There were no vitals filed for this visit.       Subjective Assessment - 01/05/17 1044    Subjective 83 yo Female s/p fall on 10/20/16 when she was trying to get up out of the chair. she sustained a left distal fibula fracture and 5th metatarsal fracture; she also reports soreness on medial side of right knee; She reports that occassionally her right knee will buckle. she uses the RW when outside of the home. Inside her house she will use her cane occasionally or will hold onto furniture. She reports feeling fearful of falling and having limited mobility; She reports having some tingling  and burning in left foot; She reports that when she rubs lotion into it, it helps;    Pertinent History personal factors affecting rehab: lives with daughter who can bring her to therapy; high fall risk, has 1 step without rail to get to bedroom, motivated, HTN/diabetes controlled with medication;    Limitations Standing;Walking   How long can you sit comfortably? >1 hour; has trouble getting up out of chair when sitting longer than 1 hour   How long can you stand comfortably? 1 hour   How long can you walk comfortably? limited due to shortness of breath from hernia pressing on lungs;    Diagnostic tests Recent X-rays show well healing of fibula fracture;    Currently in Pain? Yes   Pain Score 3    Pain Location Shoulder   Pain Orientation Right   Pain Descriptors / Indicators Aching;Dull   Pain Type Chronic pain   Pain Onset More than a month ago   Pain Frequency Intermittent   Aggravating Factors  pressing on cane/walker   Pain Relieving Factors rest   Effect of Pain on Daily Activities decreased tolerance with UE activities;    Multiple Pain Sites No            [facility] PT Assessment - 01/05/17 0001  Assessment   Medical Diagnosis s/p left ankle fracture   Referring Provider Dr. Ramonita Lab   Onset Date/Surgical Date 10/20/16   Hand Dominance Right   Next MD Visit none schedule; released from podiatrist   Prior Therapy had SNF rehab and home health Rehab for this condition, good results; denies any outpatient PT for this condition;      Precautions   Precautions Fall   Required Braces or Orthoses --  soft ankle sleeve     Restrictions   Weight Bearing Restrictions No     Balance Screen   Has the patient fallen in the past 6 months Yes   How many times? 1   Has the patient had a decrease in activity level because of a fear of falling?  Yes   Is the patient reluctant to leave their home because of a fear of falling?  No     Home Environment   Additional Comments  lives in 2 story home with 17 steps without rails; bedroom/bathroom is on main floor; does have 1 step to get to bedroom without railing; will hold onto wall;      Prior Function   Level of Independence Independent;Independent with gait;Independent with transfers  did not use RW prior to fall;    Vocation Retired   Leisure anything with family; travel,      Cognition   Overall Cognitive Status Within Functional Limits for tasks assessed     Observation/Other Assessments   Observations well dressed, good hygiene; very nice woman;      Sensation   Light Touch Appears Intact   Additional Comments does reports some tingling along left anterior/lateral ankle, but light touch intact;      Coordination   Gross Motor Movements are Fluid and Coordinated Yes   Fine Motor Movements are Fluid and Coordinated Yes     Posture/Postural Control   Posture Comments no abnormality noted;      ROM / Strength   AROM / PROM / Strength AROM;Strength     AROM   Overall AROM Comments BUE and BLE are WFL   Right Ankle Dorsiflexion 5   Right Ankle Plantar Flexion 65   Right Ankle Inversion 32   Right Ankle Eversion 8   Left Ankle Dorsiflexion -5   Left Ankle Plantar Flexion 60   Left Ankle Inversion 32   Left Ankle Eversion 4     Strength   Overall Strength Comments BUE grossly 3+/5, BLE: hip: 4/5, knee: 4/5, ankle: R: 4/5, L: 3/5     Palpation   Palpation comment patient reports mild tenderness along left ankle; She reports no tenderness to palpation of right knee;      Transfers   Comments requires HHA to push from chair for transfers due to weakness and knee discomfort;      Ambulation/Gait   Gait Comments pt ambulates with RW, step through gait pattern. Slower step length; narrow base of support; decreased step length; good foot clearance;      Standardized Balance Assessment   Five times sit to stand comments  20 sec with 2 HHA pushing on chair (>15 sec indicates high risk for falls)    10 Meter Walk 0.75 m/s with RW, limited home/community ambulator, increased risk for falls     High Level Balance   High Level Balance Comments stands unsupported, feet apart, eyes closed, mild sway, unsteady; looses balance with SLS and tandem stance;        TREATMENT: Instructed  patient in seated red tband ankle strengthening: DF, EV, PF x10 each with mod VCs for positioning and use of tband to increase strength; Also educated patient in alphabet with LLE ankle to improve flexibility;  Provided written handout for compliance;                    PT Education - 01/05/17 1918    Education provided Yes   Education Details recommendations, HEP initiated   Person(s) Educated Patient   Methods Explanation;Verbal cues;Handout   Comprehension Verbalized understanding;Returned demonstration;Verbal cues required             PT Long Term Goals - 01/05/17 1922      PT LONG TERM GOAL #1   Title Patient will be independent in home exercise program to improve strength/mobility for better functional independence with ADLs.   Time 8   Period Weeks   Status New     PT LONG TERM GOAL #2   Title Patient (> 69 years old) will complete five times sit to stand test in < 15 seconds indicating an increased LE strength and improved balance.   Time 8   Period Weeks   Status New     PT LONG TERM GOAL #3   Title Patient will increase 10 meter walk test to >1.49m/s as to improve gait speed for better community ambulation and to reduce fall risk.   Time 8   Period Weeks   Status New     PT LONG TERM GOAL #4   Title Patient will increase LLE ankle AROM: DF: 5 degrees, EV: 15 degrees to improve functional ROM for foot clearance during ambulation to improve gait safety;    Time 8   Period Weeks   Status New     PT LONG TERM GOAL #5   Title Patient will report a worst pain of 3/10 on VAS in      left ankle       to improve tolerance with ADLs and reduced symptoms with activities.     Time 8   Period Weeks   Status New               Plan - 01/05/17 1919    Clinical Impression Statement 84 yo Female s/p fall with left fibular and 5th metatarsal fracture. Patient's recent x-rays show well healing of the fracture. She exhibits increased stiffness in left ankle and weakness. Patient is currently walking with RW when outside of home. She tested as a high fall risk. In addition to ankle discomfort, she reports intermittent right medial knee pain. She feels that she must have "tweaked" her right knee when she fell in February 2018. Patient denies any pain currently and was unable to reproduce pain during PT evaluation. Patient would benefit from skilled PT intervention to improve strength, balance and gait safety;    Rehab Potential Good   Clinical Impairments Affecting Rehab Potential positive: motivated, good caregiver support; negative: high fall risk, co-morbidites; Patient's clinical presentation is stable as her pain is localized and is improving;    PT Frequency 2x / week   PT Duration 8 weeks   PT Treatment/Interventions Aquatic Therapy;ADLs/Self Care Home Management;Cryotherapy;Moist Heat;Neuromuscular re-education;Balance training;Therapeutic exercise;Therapeutic activities;Functional mobility training;Stair training;Gait training;DME Instruction;Patient/family education;Orthotic Fit/Training;Manual techniques;Passive range of motion;Energy conservation;Taping   PT Next Visit Plan advance HEP, balance, strengthening   PT Home Exercise Plan initiated- see patient instructions;    Consulted and Agree with Plan of Care Patient  Patient will benefit from skilled therapeutic intervention in order to improve the following deficits and impairments:  Abnormal gait, Decreased endurance, Hypomobility, Decreased activity tolerance, Decreased strength, Pain, Decreased balance, Decreased mobility, Difficulty walking, Decreased range of motion, Decreased safety awareness,  Impaired flexibility  Visit Diagnosis: Stiffness of left ankle, not elsewhere classified  Pain in left ankle and joints of left foot  Muscle weakness (generalized)  Difficulty in walking, not elsewhere classified      G-Codes - 01-14-17 1925    Functional Assessment Tool Used (Outpatient Only) 10 meter walk, 5 times sit<>Stand, ankle ROM/strength;    Functional Limitation Mobility: Walking and moving around   Mobility: Walking and Moving Around Current Status [phone]) At least 20 percent but less than 40 percent impaired, limited or restricted   Mobility: Walking and Moving Around Goal Status [phone]) At least 1 percent but less than 20 percent impaired, limited or restricted       Problem List Patient Active Problem List   Diagnosis Date Noted  . Chest pain 09/22/2011  . Type 2 diabetes mellitus with stage 3 chronic kidney disease, without long-term current use of insulin ([place]) 09/22/2011  . CAD (coronary artery disease) 09/22/2011  . HLD (hyperlipidemia) 09/22/2011  . CKD (chronic kidney disease), stage III 09/22/2011  . Solitary kidney, acquired 09/22/2011  . History of colon cancer 09/22/2011  . Benign hypertension with CKD (chronic kidney disease) stage III 09/22/2011    Anhelica Fowers PT, DPT 2017/01/14, 7:26 PM  [place] [facility]IN [facility] SERVICES [address] [place], [place], [address] Phone: [phone]   Fax:  [phone]  Name: Amy Chavez MRN: [id number] Date of Birth: 1926/04/10

## 2017-01-10 ENCOUNTER — Encounter: Payer: Self-pay | Admitting: Physical Therapy

## 2017-01-10 ENCOUNTER — Ambulatory Visit: Payer: Medicare Other | Attending: Internal Medicine | Admitting: Physical Therapy

## 2017-01-10 DIAGNOSIS — R262 Difficulty in walking, not elsewhere classified: Secondary | ICD-10-CM

## 2017-01-10 DIAGNOSIS — M25672 Stiffness of left ankle, not elsewhere classified: Secondary | ICD-10-CM

## 2017-01-10 DIAGNOSIS — M6281 Muscle weakness (generalized): Secondary | ICD-10-CM | POA: Diagnosis present

## 2017-01-10 DIAGNOSIS — M25572 Pain in left ankle and joints of left foot: Secondary | ICD-10-CM

## 2017-01-10 NOTE — Therapy (Signed)
[place] MAIN [facility] SERVICES [address] [place], [place], [address] Phone: [phone]   Fax:  [phone]  Physical Therapy Treatment  Patient Details  Name: Amy Chavez MRN: [id number] Date of Birth: [birth year] Referring Provider: Dr. Ramonita Lab  Encounter Date: 01/10/2017      PT End of Session - 01/10/17 1119    Visit Number 2   Number of Visits 17   Date for PT Re-Evaluation 03/02/17   Authorization Type gcode 2   Authorization Time Period 10   PT Start Time 1110   PT Stop Time 1155   PT Time Calculation (min) 45 min   Equipment Utilized During Treatment Gait belt   Activity Tolerance Patient tolerated treatment well;No increased pain   Behavior During Therapy WFL for tasks assessed/performed      Past Medical History:  Diagnosis Date  . Anxiety    controlled with medication;   . Cancer ([place])   . Coronary artery disease   . Diabetes mellitus    controlled  . Glaucoma   . Heartburn   . Hernia   . Hypercholesterolemia   . Hypertension    controlled with medication;   . Kidney failure   . UTI (urinary tract infection)     Past Surgical History:  Procedure Laterality Date  . ABDOMINAL HYSTERECTOMY    . CATARACT EXTRACTION, BILATERAL Bilateral    2008  . CHOLECYSTECTOMY    . COLON SURGERY    . TOE SURGERY Right    1998    There were no vitals filed for this visit.      Subjective Assessment - 01/10/17 1118    Subjective Patient reports doing well; Reports compliance with HEP; She states, "I have started getting some soreness in my shoulder due to pushing on the walker." She reports working on the sit<>stand in the bathroom and reports that she can transfer with just 1 hand instead of 2;    Pertinent History personal factors affecting rehab: lives with daughter who can bring her to therapy; high fall risk, has 1 step without rail to get to bedroom, motivated, HTN/diabetes controlled with medication;     Limitations Standing;Walking   How long can you sit comfortably? >1 hour; has trouble getting up out of chair when sitting longer than 1 hour   How long can you stand comfortably? 1 hour   How long can you walk comfortably? limited due to shortness of breath from hernia pressing on lungs;    Diagnostic tests Recent X-rays show well healing of fibula fracture;    Currently in Pain? No/denies   Pain Onset More than a month ago       TREATMENT: Warm up on Nustep BUE/BLE level 2 x4 min (unbilled);  Balance: Standing on 1/2 bolster (round side up) Heel raises x10 with 2 HHA; required min Vcs to keep knees straight and increase ROM for better ankle strengthening; Standing on 1/2 bolster (flat side up) heel/toe raises ankle rocks x15 with 2 HHA on rails with cues to keep knees straight for increased ankle ROM/stretch; Standing on 1/2 bolster (flat side up) feet in neutral, BUE wand flexion x10 with min A for balance and cues to improve weight shift to facilitate better ankle strategies in stance for balance;   Exercise: Sitting in chair: BAPs board, LLE only Level 2, DF/PF x10, EV/IV x10, circles clockwise/counterclockwise x10 each with mod VCs for foot position, increase ROM and to avoid knee ROM for better  ankle flexibility and to challenge ankle coordination;  Leg press: BLE plate 75#, 82# X93 each with cues for positioning and to slow down LE movement for better strengthening; BLE heel raises, plate 75#, 71#, I96 each with cues to keep knees straight to isolate ankle ROM;   Long sitting: LLE ankle DF, EV, IV red tband 2x15 each with min A to avoid knee rotation and isolate ankle ROM for better strengthening; Patient exhibits decreased ankle EV/IV due to weakness;  She denies any pain after treatment session; She was able to walk around gym x80 feet with CGA to close supervision demonstrating short shuffled steps, no AD, requiring min VCs to increase heel/toe walking for better ankle DF at  heel strike for improved foot clearance;                         PT Education - 01/10/17 1119    Education provided Yes   Education Details LE strengthening, exercise technique; HEP reinforced;    Person(s) Educated Patient   Methods Explanation;Verbal cues   Comprehension Returned demonstration;Verbalized understanding;Verbal cues required             PT Long Term Goals - 01/05/17 1922      PT LONG TERM GOAL #1   Title Patient will be independent in home exercise program to improve strength/mobility for better functional independence with ADLs.   Time 8   Period Weeks   Status New     PT LONG TERM GOAL #2   Title Patient (> 81 years old) will complete five times sit to stand test in < 15 seconds indicating an increased LE strength and improved balance.   Time 8   Period Weeks   Status New     PT LONG TERM GOAL #3   Title Patient will increase 10 meter walk test to >1.21m/s as to improve gait speed for better community ambulation and to reduce fall risk.   Time 8   Period Weeks   Status New     PT LONG TERM GOAL #4   Title Patient will increase LLE ankle AROM: DF: 5 degrees, EV: 15 degrees to improve functional ROM for foot clearance during ambulation to improve gait safety;    Time 8   Period Weeks   Status New     PT LONG TERM GOAL #5   Title Patient will report a worst pain of 3/10 on VAS in      left ankle       to improve tolerance with ADLs and reduced symptoms with activities.    Time 8   Period Weeks   Status New               Plan - 01/10/17 1648    Clinical Impression Statement Instructed patient in balance exercise and LE strengthening exercise. she requires Min VCs for correct exercise technique; Patient denies any increase in pain with advanced exercise. She does exhibit stiffness in left ankle having difficulty with BAPs board. Patient would benefit from additional skilled PT intervention to improve strength, balance and gait  safety;    Rehab Potential Good   Clinical Impairments Affecting Rehab Potential positive: motivated, good caregiver support; negative: high fall risk, co-morbidites; Patient's clinical presentation is stable as her pain is localized and is improving;    PT Frequency 2x / week   PT Duration 8 weeks   PT Treatment/Interventions Aquatic Therapy;ADLs/Self Care Home Management;Cryotherapy;Moist Heat;Neuromuscular re-education;Balance training;Therapeutic exercise;Therapeutic  activities;Functional mobility training;Stair training;Gait training;DME Instruction;Patient/family education;Orthotic Fit/Training;Manual techniques;Passive range of motion;Energy conservation;Taping   PT Next Visit Plan advance HEP, balance, strengthening   PT Home Exercise Plan initiated- see patient instructions;    Consulted and Agree with Plan of Care Patient      Patient will benefit from skilled therapeutic intervention in order to improve the following deficits and impairments:  Abnormal gait, Decreased endurance, Hypomobility, Decreased activity tolerance, Decreased strength, Pain, Decreased balance, Decreased mobility, Difficulty walking, Decreased range of motion, Decreased safety awareness, Impaired flexibility  Visit Diagnosis: Stiffness of left ankle, not elsewhere classified  Pain in left ankle and joints of left foot  Muscle weakness (generalized)  Difficulty in walking, not elsewhere classified     Problem List Patient Active Problem List   Diagnosis Date Noted  . Chest pain 09/22/2011  . Type 2 diabetes mellitus with stage 3 chronic kidney disease, without long-term current use of insulin ([place]) 09/22/2011  . CAD (coronary artery disease) 09/22/2011  . HLD (hyperlipidemia) 09/22/2011  . CKD (chronic kidney disease), stage III 09/22/2011  . Solitary kidney, acquired 09/22/2011  . History of colon cancer 09/22/2011  . Benign hypertension with CKD (chronic kidney disease) stage III 09/22/2011     Rio Taber PT, DPT 01/10/2017, 4:52 PM  [place] MAIN [facility] SERVICES [address] [place], [place], [address] Phone: [phone]   Fax:  [phone]  Name: Amy Chavez MRN: [id number] Date of Birth: 01/01/1926

## 2017-01-12 ENCOUNTER — Ambulatory Visit: Payer: Medicare Other | Admitting: Physical Therapy

## 2017-01-12 ENCOUNTER — Emergency Department
Admission: EM | Admit: 2017-01-12 | Discharge: 2017-01-12 | Disposition: A | Discharge status: Home / Self Care | Payer: Medicare Other | Attending: Emergency Medicine | Admitting: Emergency Medicine

## 2017-01-12 ENCOUNTER — Encounter: Payer: Self-pay | Admitting: Emergency Medicine

## 2017-01-12 ENCOUNTER — Emergency Department: Payer: Medicare Other

## 2017-01-12 DIAGNOSIS — Z79899 Other long term (current) drug therapy: Secondary | ICD-10-CM | POA: Insufficient documentation

## 2017-01-12 DIAGNOSIS — S40021A Contusion of right upper arm, initial encounter: Secondary | ICD-10-CM

## 2017-01-12 DIAGNOSIS — S92354A Nondisplaced fracture of fifth metatarsal bone, right foot, initial encounter for closed fracture: Secondary | ICD-10-CM | POA: Insufficient documentation

## 2017-01-12 DIAGNOSIS — Y929 Unspecified place or not applicable: Secondary | ICD-10-CM | POA: Diagnosis not present

## 2017-01-12 DIAGNOSIS — W228XXA Striking against or struck by other objects, initial encounter: Secondary | ICD-10-CM | POA: Insufficient documentation

## 2017-01-12 DIAGNOSIS — N183 Chronic kidney disease, stage 3 (moderate): Secondary | ICD-10-CM | POA: Diagnosis not present

## 2017-01-12 DIAGNOSIS — S99921A Unspecified injury of right foot, initial encounter: Secondary | ICD-10-CM | POA: Diagnosis present

## 2017-01-12 DIAGNOSIS — E1122 Type 2 diabetes mellitus with diabetic chronic kidney disease: Secondary | ICD-10-CM | POA: Diagnosis not present

## 2017-01-12 DIAGNOSIS — I129 Hypertensive chronic kidney disease with stage 1 through stage 4 chronic kidney disease, or unspecified chronic kidney disease: Secondary | ICD-10-CM | POA: Insufficient documentation

## 2017-01-12 DIAGNOSIS — Y939 Activity, unspecified: Secondary | ICD-10-CM | POA: Insufficient documentation

## 2017-01-12 DIAGNOSIS — M25461 Effusion, right knee: Secondary | ICD-10-CM

## 2017-01-12 DIAGNOSIS — I2510 Atherosclerotic heart disease of native coronary artery without angina pectoris: Secondary | ICD-10-CM | POA: Diagnosis not present

## 2017-01-12 DIAGNOSIS — Z85038 Personal history of other malignant neoplasm of large intestine: Secondary | ICD-10-CM | POA: Insufficient documentation

## 2017-01-12 DIAGNOSIS — M7041 Prepatellar bursitis, right knee: Secondary | ICD-10-CM | POA: Diagnosis not present

## 2017-01-12 DIAGNOSIS — Y999 Unspecified external cause status: Secondary | ICD-10-CM | POA: Diagnosis not present

## 2017-01-12 DIAGNOSIS — W19XXXA Unspecified fall, initial encounter: Secondary | ICD-10-CM

## 2017-01-12 MED ORDER — BACITRACIN-NEOMYCIN-POLYMYXIN 400-5-5000 EX OINT
TOPICAL_OINTMENT | Freq: Once | CUTANEOUS | Status: AC
  Administered 2017-01-12: 1 via TOPICAL
  Filled 2017-01-12: qty 1

## 2017-01-12 MED ORDER — ACETAMINOPHEN 500 MG PO TABS
1000.0000 mg | ORAL_TABLET | Freq: Once | ORAL | Status: AC
  Administered 2017-01-12: 1000 mg via ORAL
  Filled 2017-01-12: qty 2

## 2017-01-12 NOTE — ED Provider Notes (Addendum)
[facility] Emergency Department Provider Note  ____________________________________________  Time seen: Approximately 10:41 AM  I have reviewed the triage vital signs and the nursing notes.   HISTORY  Chief Complaint Fall    HPI Amy Chavez is a 83 y.o. female with a history of HTN, HL, DM, recent left ankle fracture, presenting with pain in the right shoulder, bilateral knees, right ankle and right foot after fall. The patient reports she kicked a box, and then lost her balance, landing onto her right side. She did not hit her head, lose consciousnessor faint. She denies any associated chest pain, shortness of breath, palpitations. She denies any neck or back pain. No numbness weakness or tingling. She was able to get back up and has been ambulatory.   Past Medical History:  Diagnosis Date  . Anxiety    controlled with medication;   . Cancer ([place])   . Coronary artery disease   . Diabetes mellitus    controlled  . Glaucoma   . Heartburn   . Hernia   . Hypercholesterolemia   . Hypertension    controlled with medication;   . Kidney failure   . UTI (urinary tract infection)     Patient Active Problem List   Diagnosis Date Noted  . Chest pain 09/22/2011  . Type 2 diabetes mellitus with stage 3 chronic kidney disease, without long-term current use of insulin ([place]) 09/22/2011  . CAD (coronary artery disease) 09/22/2011  . HLD (hyperlipidemia) 09/22/2011  . CKD (chronic kidney disease), stage III 09/22/2011  . Solitary kidney, acquired 09/22/2011  . History of colon cancer 09/22/2011  . Benign hypertension with CKD (chronic kidney disease) stage III 09/22/2011    Past Surgical History:  Procedure Laterality Date  . ABDOMINAL HYSTERECTOMY    . CATARACT EXTRACTION, BILATERAL Bilateral    2008  . CHOLECYSTECTOMY    . COLON SURGERY    . TOE SURGERY Right    1998    Current Outpatient Rx  . Order #: [id number] Class: Historical Med  .  Order #: [id number] Class: Historical Med  . Order #: [id number] Class: Historical Med  . Order #: [id number] Class: Historical Med  . Order #: [id number] Class: Historical Med  . Order #: [id number] Class: Historical Med  . Order #: [id number] Class: Historical Med  . Order #: [id number] Class: Historical Med  . Order #: [id number] Class: Historical Med  . Order #: [id number] Class: Historical Med  . Order #: [id number] Class: Historical Med  . Order #: [id number] Class: Historical Med  . Order #: [id number] Class: Historical Med  . Order #: [id number] Class: Historical Med  . Order #: [id number] Class: Historical Med  . Order #: [id number] Class: Print    Allergies Cephalexin; Ciprofloxacin; Other; Penicillins; Sulfa antibiotics; and Tape  Family History  Problem Relation Age of Onset  . Heart disease Father   . Prostate cancer Neg Hx   . Kidney cancer Neg Hx   . Kidney disease Neg Hx   . Bladder Cancer Neg Hx     Social History Social History  Substance Use Topics  . Smoking status: Never Smoker  . Smokeless tobacco: Never Used  . Alcohol use No    Review of Systems Constitutional: No fever/chills.Positive mechanical fall. Negative lightheadedness or syncope. Eyes: No visual changes. No blurred vision or double vision. ENT: No sore throat. No congestion or rhinorrhea. Cardiovascular: Denies chest pain. Denies palpitations. Respiratory: Denies shortness of breath.  No cough. Gastrointestinal: No abdominal pain.  No nausea, no vomiting.  No diarrhea.  No constipation.  Genitourinary: Negative for dysuria. Musculoskeletal: Negative for back pain. Negative for neck pain. Positive for right shoulder pain under the armpit, bilateral knee pain, right ankle pain, and foot pain.  Skin: Negative for rash. Positive for skin tear on the right arm. Neurological: Negative for headaches. No focal numbness, tingling or weakness. No difficulty walking.  10-point ROS otherwise  negative.  ____________________________________________   PHYSICAL EXAM:  VITAL SIGNS: ED Triage Vitals [01/12/17 1001]  Enc Vitals Group     BP      Pulse      Resp      Temp      Temp src      SpO2      Weight 171 lb (77.6 kg)     Height 5\' 3"  (1.6 m)     Head Circumference      Peak Flow      Pain Score 9     Pain Loc      Pain Edu?      Excl. in [place]?     Constitutional: Alert and oriented. Well appearing and in no acute distress. Answers questions appropriately. GCS is 15. Eyes: Conjunctivae are normal.  EOMI. No scleral icterus. No raccoon eyes. Head: Atraumatic. No Battle sign. Nose: No congestion/rhinnorhea. No swelling over the nose or septal hematoma. Mouth/Throat: Mucous membranes are moist. No malocclusion or dental injury. Neck: No stridor.  Supple.  No midline C-spine tenderness to palpation, step-offs or deformities. Full range of motion without pain. Cardiovascular: Normal rate, regular rhythm. No murmurs, rubs or gallops.  Respiratory: Normal respiratory effort.  No accessory muscle use or retractions. Lungs CTAB.  No wheezes, rales or ronchi. Gastrointestinal: Soft, nontender and nondistended.  No guarding or rebound.  No peritoneal signs. Musculoskeletal: Pelvis is stable. Full range of motion of the left shoulder, bilateral elbows, bilateral wrists, left hip, left ankle without pain. Mild pain with range of motion of the right shoulder, right knee, on the medial aspect of the right ankle and on the dorsum of the right foot on the right side. The patient does have an effusion on the medial aspect of the left knee. Normal DP and PT pulses bilaterally. Sensation to light touch in the bilateral legs. Neurologic:  A&Ox3.  Speech is clear.  Face and smile are symmetric.  EOMI.  Moves all extremities well. Skin:  Skin is warm, dry. The patient has a 1 x 1.5 cm skin tear that is superficial on the right forearm and forearm. The patient has a 3 cm by one semi-or bruise  in the right axilla without palpable crepitus or rib instability. Psychiatric: Mood and affect are normal. Speech and behavior are normal.  Normal judgement.  ____________________________________________   LABS (all labs ordered are listed, but only abnormal results are displayed)  Labs Reviewed - No data to display ____________________________________________  EKG  Not indicated ____________________________________________  RADIOLOGY  Dg Shoulder Right  Result Date: 01/12/2017 CLINICAL DATA:  Status post fall, right shoulder pain EXAM: RIGHT SHOULDER - 2+ VIEW COMPARISON:  None. FINDINGS: There is no fracture or dislocation. There is mild osteoarthritis of the right glenohumeral joint. There are mild degenerative changes of the acromioclavicular joint. IMPRESSION: No acute osseous injury of the right shoulder. Electronically Signed   By: Kathreen Devoid   On: 01/12/2017 11:46   Dg Ankle Complete Right  Result Date: 01/12/2017 CLINICAL DATA:  83 year old female status post fall at home this morning. Pain. Initial encounter. EXAM: RIGHT ANKLE - COMPLETE 3+  VIEW COMPARISON:  Right ankle series 2818 FINDINGS: Calcified peripheral vascular disease. Stable bone mineralization. Mortise joint alignment preserved. Talar dome intact. No ankle joint effusion identified. Distal tibia and fibula appear stable and intact. Calcaneus is stable and intact. Transverse fracture of the proximal right fifth metatarsal is new since February. See right foot series today. IMPRESSION: 1. No acute fracture or dislocation identified about the right ankle, but positive for right fifth metatarsal fracture. 2. Calcified peripheral vascular disease. Electronically Signed   By: Genevie Ann M.D.   On: 01/12/2017 11:47   Dg Knee Complete 4 Views Left  Result Date: 01/12/2017 CLINICAL DATA:  Left knee pain.  Status post fall. EXAM: LEFT KNEE - COMPLETE 4+ VIEW COMPARISON:  None. FINDINGS: Generalized osteopenia. No acute fracture  or dislocation. Mild tricompartmental osteoarthritis of the left knee. 13 mm bone lesion with stippled sclerosis and central lucency in the distal left femoral metaphysis most consistent with a benign chondroid lesion such as an enchondroma unchanged compared with 08/05/2014. No significant joint effusion. IMPRESSION: No acute osseous injury of the left knee. Electronically Signed   By: Kathreen Devoid   On: 01/12/2017 11:45   Dg Knee Complete 4 Views Right  Result Date: 01/12/2017 CLINICAL DATA:  Fall this morning in home. Pt c/o bilateral knee pain with abrasions on patellar surfaces. Denies previous injury or surgery to bilateral knees. EXAM: RIGHT KNEE - COMPLETE 4+ VIEW COMPARISON:  All 10/20/2016 FINDINGS: No acute fracture.  No bone lesion. Knee joint is normally aligned. There is mild medial joint space compartment narrowing. Marginal osteophytes project from although compartments most evident medially. Chondrocalcinosis projects along the menisci. Small joint effusion. Mild subcutaneous edema is noted anteriorly. IMPRESSION: 1. No fracture or dislocation. 2. Mild degenerative changes stable from the prior study. 3. Small joint effusion. Electronically Signed   By: Lajean Manes M.D.   On: 01/12/2017 11:47   Dg Foot Complete Right  Result Date: 01/12/2017 CLINICAL DATA:  83 year old female status post fall at home this morning. Pain. Initial encounter. EXAM: RIGHT FOOT COMPLETE - 3+ VIEW COMPARISON:  Right ankle series today reported separately. FINDINGS: Osteopenia. Non displaced transverse fracture through the base of the right fifth metatarsal. Small accessory ossicle suspected adjacent to the cuboid. The cuboid appears to remain intact. Other metatarsals appear intact. Tarsal bone alignment and joint spaces are within normal limits. Subacute appearing fracture of the third proximal phalanx with no displacement and periosteal reaction. IMPRESSION: 1. Acute non displaced transverse fracture through the  base of the right fifth metatarsal. 2. Subacute appearing nondisplaced third proximal phalanx fracture with periosteal new bone formation. Electronically Signed   By: Genevie Ann M.D.   On: 01/12/2017 11:49    ____________________________________________   PROCEDURES  Procedure(s) performed: None  Procedures  Critical Care performed: No ____________________________________________   INITIAL IMPRESSION / ASSESSMENT AND PLAN / ED COURSE  Pertinent labs & imaging results that were available during my care of the patient were reviewed by me and considered in my medical decision making (see chart for details).  83 y.o. female with a mechanical fall resulting in right shoulder pain, right axillary bruising, bilateral knee pain with effusion in the left knee, right ankle and right foot pain. We'll get x-rays to evaluate for fracture. I will treat the patient with Tylenol and cryotherapy, as well as Neosporin skin tear. Plan reevaluation for final disposition.  ----------------------------------------- 12:48 PM on 01/12/2017 -----------------------------------------  The patient does have a nondisplaced right fifth metatarsal  fracture, for which I will place a posterior slab for mobilization and the patient will be nonweightbearing. I have spoken with Dr. Harlow Mares, who will see the patient in his clinic tomorrow.  The patient has a left knee effusion and I have given her instructions to minimize this. Remainder of the patient's bony studies were negative. At this time, her pain is significantly improved after Tylenol. Plan discharge. She understands return precautions as well as follow-up instructions.  SPLINT APPLICATION Date/Time: 76:16 PM Authorized by: Eula Listen Consent: Verbal consent obtained. Risks and benefits: risks, benefits and alternatives were discussed Consent given by: patient Splint applied by: ED technician Location details: right foot Splint type: dorsal  slab Supplies used: Orthoglass Post-procedure: The splinted body part was neurovascularly unchanged following the procedure. Patient tolerance: Patient tolerated the procedure well with no immediate complications.     ____________________________________________  FINAL CLINICAL IMPRESSION(S) / ED DIAGNOSES  Final diagnoses:  Nondisplaced fracture of fifth metatarsal bone, right foot, initial encounter for closed fracture  Effusion of bursa of right knee  Traumatic ecchymosis of right upper arm, initial encounter  Fall, initial encounter         NEW MEDICATIONS STARTED DURING THIS VISIT:  New Prescriptions   No medications on file      Eula Listen, MD 01/12/17 1249    Eula Listen, MD 01/12/17 1249

## 2017-01-12 NOTE — ED Notes (Signed)
Splint placed by Tommy, EDM. Assisted by this RN. Patient tolerated well.

## 2017-01-12 NOTE — ED Triage Notes (Signed)
Patient presents to ED via [facility] post fall. Patient is from home, patient lives with family. Patient was attempting to kick a box out of her way and tripped and fell. Patient c/o right sided pain. Patient denies hitting head or LOC. No blood thinners.

## 2017-01-12 NOTE — Discharge Instructions (Signed)
For the fluid on your left knee, keep your leg elevated as much as possible, and apply ice for 15 minutes every 2 hours.  You may take Tylenol or Motrin for your pain.  Please keep the splint on your foot fracture at all times, until you're cleared by the orthopedic doctor.  Return to the emergency department if you develop severe pain, vomiting, numbness tingling or weakness, or any other symptoms concerning to you.

## 2017-01-17 ENCOUNTER — Encounter: Payer: Medicare Other | Admitting: Physical Therapy

## 2017-01-19 ENCOUNTER — Encounter: Payer: Medicare Other | Admitting: Physical Therapy

## 2017-01-24 ENCOUNTER — Encounter: Payer: Medicare Other | Admitting: Physical Therapy

## 2017-01-26 ENCOUNTER — Encounter: Payer: Medicare Other | Admitting: Physical Therapy

## 2017-01-31 ENCOUNTER — Encounter: Payer: Medicare Other | Admitting: Physical Therapy

## 2017-02-07 ENCOUNTER — Ambulatory Visit: Payer: Medicare Other | Admitting: Physical Therapy

## 2017-02-09 ENCOUNTER — Ambulatory Visit: Payer: Medicare Other | Admitting: Physical Therapy

## 2017-02-14 ENCOUNTER — Encounter: Payer: Medicare Other | Admitting: Physical Therapy

## 2017-02-16 ENCOUNTER — Encounter: Payer: Medicare Other | Admitting: Physical Therapy

## 2017-02-21 ENCOUNTER — Encounter: Payer: Medicare Other | Admitting: Physical Therapy

## 2017-02-23 ENCOUNTER — Encounter: Payer: Medicare Other | Admitting: Physical Therapy

## 2017-02-28 ENCOUNTER — Ambulatory Visit: Payer: Medicare Other | Admitting: Physical Therapy

## 2017-03-02 ENCOUNTER — Ambulatory Visit: Payer: Medicare Other | Admitting: Urology

## 2017-03-02 NOTE — Progress Notes (Deleted)
03/02/2017 7:34 AM   Amy Chavez Jun 20, 1926 [id number]  Referring provider: Tracie Harrier, [facility] [place], [place] [address]  No chief complaint on file.   HPI: 83 yo WF with CKD, bladder diverticulum, frequency, nocturia, mixed incontinence, vaginal atrophy, cystocele, atrophic right kidney and microscopic hematuria who presents today for a 2 month follow up.    Bladder diverticulum Multiple large bladder wall diverticulae, measuring up to 3.7 cm on the right side seen on 11/11/2016 CTU.   Drinking three 16 ounces cups of water at day.  Is not engaging in timed voiding. PVR was 410 mL - but not a true PVR as patient had not voiding  Frequency Patient is visiting the restroom x 5-6 daily.    Nocturia She is having to get up four times nightly.   Mixed incontinence Patient with large residuals.  She using 6 POISE pads daily.  Worsened with higher BS.    Vaginal atrophy Patient applying cream three nights weekly.   Cystocele Grade I cystocele.    Atrophic right kidney Cr 1.4 in 12/2016  Microscopic hematuria CTU completed on 11/11/2016 with findings of a bladder wall diverticulae and a right atrophic kidney.  Cystoscopy not completed.     PMH: Past Medical History:  Diagnosis Date  . Anxiety    controlled with medication;   . Cancer ([place])   . Coronary artery disease   . Diabetes mellitus    controlled  . Glaucoma   . Heartburn   . Hernia   . Hypercholesterolemia   . Hypertension    controlled with medication;   . Kidney failure   . UTI (urinary tract infection)     Surgical History: Past Surgical History:  Procedure Laterality Date  . ABDOMINAL HYSTERECTOMY    . CATARACT EXTRACTION, BILATERAL Bilateral    2008  . CHOLECYSTECTOMY    . COLON SURGERY    . TOE SURGERY Right    1998    Home Medications:  Allergies as of 03/02/2017      Reactions   Cephalexin Other (See Comments)   Low back pain   Ciprofloxacin Other (See Comments)   Other Other (See Comments)   Skin rips   Penicillins Nausea And Vomiting   Sulfa Antibiotics Other (See Comments)   Sleeplessness, joint pain, cramps   Tape    Skin rips      Medication List       Accurate as of 03/02/17  7:34 AM. Always use your most recent med list.          acetaminophen 500 MG tablet Commonly known as:  TYLENOL Take 500 mg by mouth every 6 (six) hours as needed.   ALPRAZolam 0.25 MG tablet Commonly known as:  XANAX Take 0.25 mg by mouth daily as needed. For anxiety   Cholecalciferol 1000 units capsule Take 2,000 Units by mouth daily.   clopidogrel 75 MG tablet Commonly known as:  PLAVIX Take 75 mg by mouth daily.   conjugated estrogens vaginal cream Commonly known as:  PREMARIN Place 1 Applicatorful vaginally 3 (three) times a week.   Cranberry 500 MG Caps Take 500 mg by mouth daily.   cyanocobalamin 500 MCG tablet Take 500 mcg by mouth daily.   cyanocobalamin 1000 MCG/ML injection Commonly known as:  (VITAMIN B-12) Inject 1,000 mcg into the muscle every 4 (four) months.   esomeprazole 40 MG capsule Commonly known as:  NEXIUM Take 40 mg by mouth daily  before breakfast.   ezetimibe-simvastatin 10-40 MG tablet Commonly known as:  VYTORIN Take 1 tablet by mouth at bedtime.   ferrous sulfate 325 (65 FE) MG tablet Take 325 mg by mouth daily with breakfast.   fexofenadine 180 MG tablet Commonly known as:  ALLEGRA Take 180 mg by mouth daily as needed. For allergies   FIFTY50 GLUCOSE METER 2.0 w/Device Kit Use to test blood sugars once daily.  Dx code: E11.9   glyBURIDE 5 MG tablet Commonly known as:  DIABETA Take 7.5 mg by mouth 2 (two) times daily.   HYDROcodone-acetaminophen 5-325 MG tablet Commonly known as:  NORCO/VICODIN Take 1 tablet by mouth every 4 (four) hours as needed for moderate pain.   vitamin C 500 MG tablet Commonly known as:  ASCORBIC ACID Take 500 mg by mouth daily.        Allergies:  Allergies  Allergen Reactions  . Cephalexin Other (See Comments)    Low back pain  . Ciprofloxacin Other (See Comments)  . Other Other (See Comments)    Skin rips  . Penicillins Nausea And Vomiting  . Sulfa Antibiotics Other (See Comments)    Sleeplessness, joint pain, cramps  . Tape     Skin rips    Family History: Family History  Problem Relation Age of Onset  . Heart disease Father   . Prostate cancer Neg Hx   . Kidney cancer Neg Hx   . Kidney disease Neg Hx   . Bladder Cancer Neg Hx     Social History:  reports that she has never smoked. She has never used smokeless tobacco. She reports that she does not drink alcohol or use drugs.  ROS:                                        Physical Exam: There were no vitals taken for this visit.  Constitutional: Well nourished. Alert and oriented, No acute distress. HEENT: [place] AT, moist mucus membranes. Trachea midline, no masses. Cardiovascular: No clubbing, cyanosis, or edema. Respiratory: Normal respiratory effort, no increased work of breathing. Skin: No rashes, bruises or suspicious lesions. Lymph: No cervical or inguinal adenopathy. Neurologic: Grossly intact, no focal deficits, moving all 4 extremities. Psychiatric: Normal mood and affect.  Laboratory Data: Lab Results  Component Value Date   WBC 12.7 (H) 10/22/2016   HGB 9.9 (L) 10/22/2016   HCT 31.7 (L) 10/22/2016   MCV 81.6 10/22/2016   PLT 297 10/22/2016    Lab Results  Component Value Date   CREATININE 1.94 (H) 10/22/2016     Lab Results  Component Value Date   HGBA1C 6.4 (H) 09/23/2011    Lab Results  Component Value Date   TSH 3.560 06/27/2012       Component Value Date/Time   CHOL 123 09/23/2011 0440   HDL 62 09/23/2011 0440   CHOLHDL 2.0 09/23/2011 0440   VLDL 17 09/23/2011 0440   LDLCALC 44 09/23/2011 0440    Lab Results  Component Value Date   AST 13 (L) 10/22/2016   Lab Results   Component Value Date   ALT 10 (L) 10/22/2016    Assessment & Plan:    1. Bladder diverticulae  - encouraged to drink water  - encouraged to void every two hours while awake  - RTC in three months for OAB questionnaire and PVR  2. Frequency  - no ureteral or  bladder stones seen on CT to explain frequency  - DM under better control now that she is out of rehabilitation therapy  - continue conservative management at this time as she and her daughter are not willing to preform CIC  3. Nocturia  - no etiology for nocturia seen on CT to explain nocturia  - I explained to the patient that nocturia is often multi-factorial and difficult to treat.  Sleeping disorders, heart conditions, peripheral vascular disease, diabetes, an enlarged prostate for men, an urethral stricture causing bladder outlet obstruction and/or certain medications can contribute to nocturia.  - I have suggested that the patient avoid caffeine after noon and alcohol in the evening.  He or she may also benefit from fluid restrictions after 6:00 in the evening and voiding just prior to bedtime.  - I have explained that research studies have showed that over 84% of patients with sleep apnea reported frequent nighttime urination.   With sleep apnea, oxygen decreases, carbon dioxide increases, the blood become more acidic, the heart rate drops and blood vessels in the lung constrict.  The body is then alerted that something is very wrong. The sleeper must wake enough to reopen the airway. By this time, the heart is racing and experiences a false signal of fluid overload. The heart excretes a hormone-like protein that tells the body to get rid of sodium and water, resulting in nocturia.  - There is also an increased incidence in sleep apnea with menopause, symptoms include night sweats, daytime sleepiness, depressed mood, and cognitive complaints like poor concentration or problems with short-term memory   - The patient may benefit from  a discussion with his or her primary care physician to see if he or she has risk factors for sleep apnea or other sleep disturbances and obtaining a sleep study - will pursue when recovers from ankle fracture  - worsened with elevated BS at night  4. Mixed incontinence  - bladder diverticulae found on CT  - moderate residuals  - continue conservative management with depends  5. Vaginal atrophy  - She is using the cream three nights weekly  - RTC in 3 months for exam  6. Cystocele  - Grade I - continue vaginal estrogen cream today  - RTC in 3 months for exam  7. Atrophic right kidney   - seen on CT  - recent creatinine is 1.4 on 12/2016  8. Microscopic hematuria  - no etiology for AHM seen on CT scan  - no AMH on today's UA   - discussed cystoscopy with patient and daughter; will hold for now   No Follow-up on file.  These notes generated with voice recognition software. I apologize for typographical errors.  Zara Council, [facility] [address], [place], [place] [address] [phone]

## 2017-03-07 ENCOUNTER — Encounter: Payer: Medicare Other | Admitting: Physical Therapy

## 2017-03-09 ENCOUNTER — Encounter: Payer: Medicare Other | Admitting: Physical Therapy

## 2017-03-14 ENCOUNTER — Encounter: Payer: Medicare Other | Admitting: Physical Therapy

## 2017-03-16 ENCOUNTER — Encounter: Payer: Medicare Other | Admitting: Physical Therapy

## 2017-03-21 ENCOUNTER — Encounter: Payer: Medicare Other | Admitting: Physical Therapy

## 2017-03-23 ENCOUNTER — Encounter: Payer: Medicare Other | Admitting: Physical Therapy

## 2017-03-28 ENCOUNTER — Encounter: Payer: Medicare Other | Admitting: Physical Therapy

## 2017-03-30 ENCOUNTER — Encounter: Payer: Medicare Other | Admitting: Physical Therapy

## 2017-04-04 ENCOUNTER — Encounter: Payer: Medicare Other | Admitting: Physical Therapy

## 2017-04-06 ENCOUNTER — Encounter: Payer: Medicare Other | Admitting: Physical Therapy

## 2017-04-11 ENCOUNTER — Encounter: Payer: Medicare Other | Admitting: Physical Therapy

## 2017-04-13 ENCOUNTER — Encounter: Payer: Medicare Other | Admitting: Physical Therapy

## 2019-02-07 IMAGING — CT CT ABD-PELV W/O CM
1 of 2 series · 14 of 32 positions shown, 18 images · non-contrast
Comparison: No priors.

CLINICAL DATA: 89-year-old female with history of solitary kidney
presenting with microscopic hematuria. History of chronic kidney
disease. Recurrent urinary tract infections for several years.
Difficulty holding urinary bladder when lying down flat.

EXAM:
CT ABDOMEN AND PELVIS WITHOUT CONTRAST
TECHNIQUE: Multidetector CT imaging of the abdomen and pelvis was performed
following the standard protocol without IV contrast.

[Series 2: axial st · axial · 0.68mm/px · z∈[-877,-472]mm · 14 of 93 slices shown, 18 images]
[im 8/93  soft-tissue]
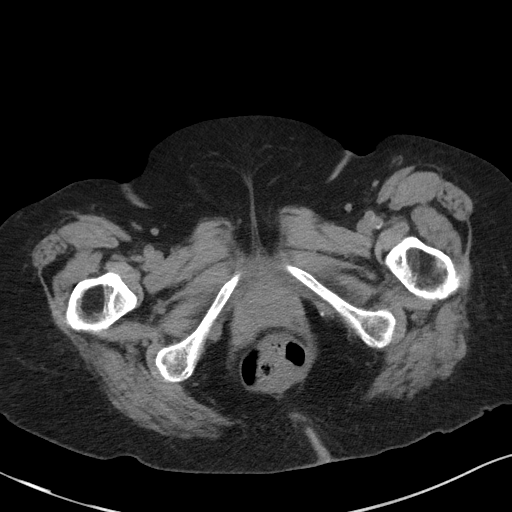
[im 8/93  bone]
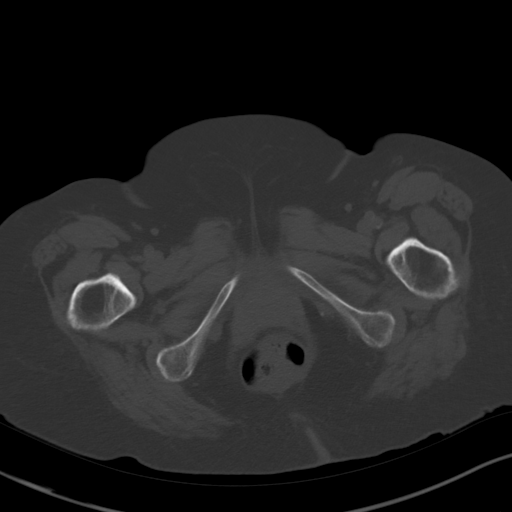
[im 15/93  soft-tissue]
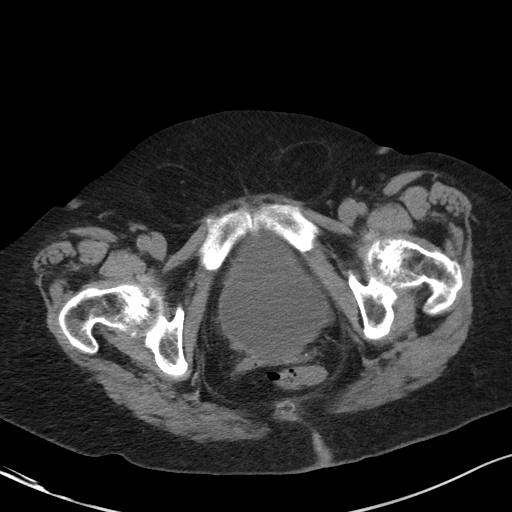
[im 23/93  soft-tissue]
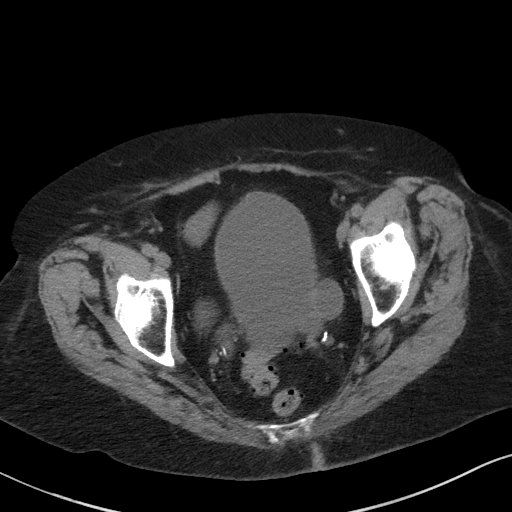
[im 30/93  soft-tissue]
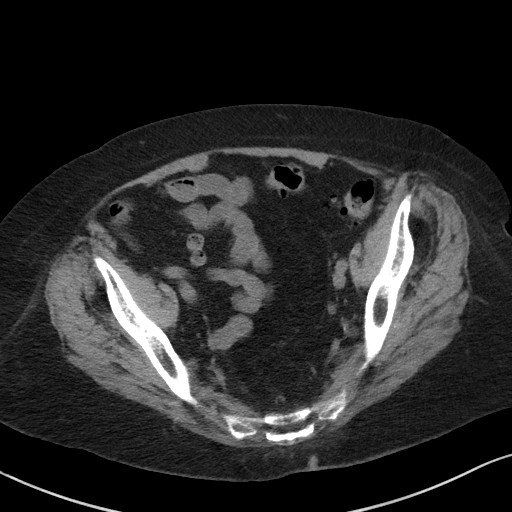
[im 37/93  soft-tissue]
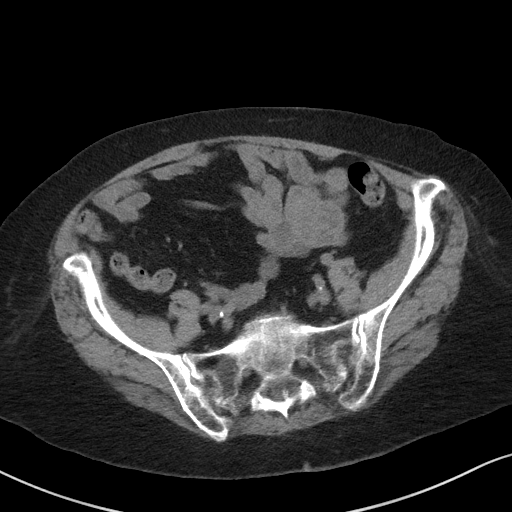
[im 45/93  soft-tissue]
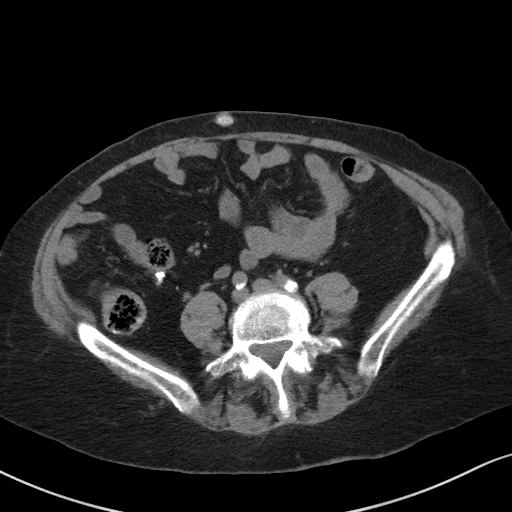
[im 52/93  soft-tissue]
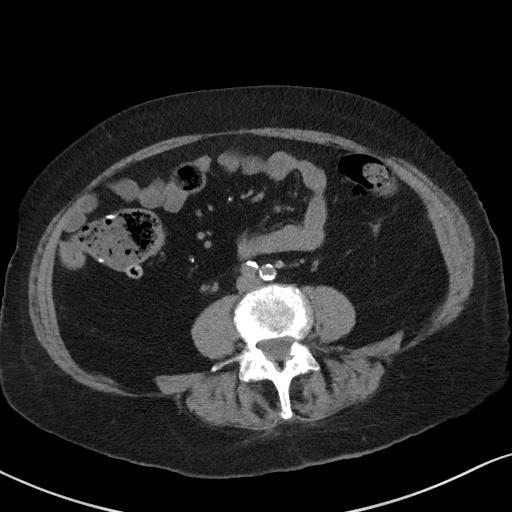
[im 59/93  soft-tissue]
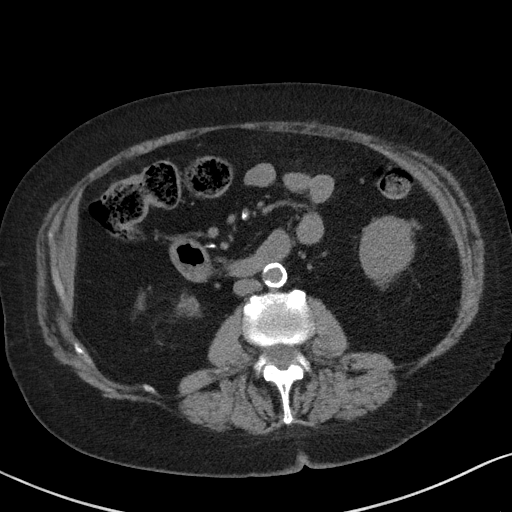
[im 67/93  soft-tissue]
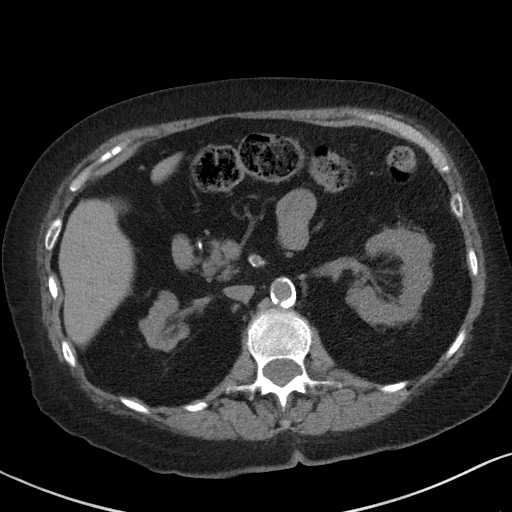
[im 67/93  bone]
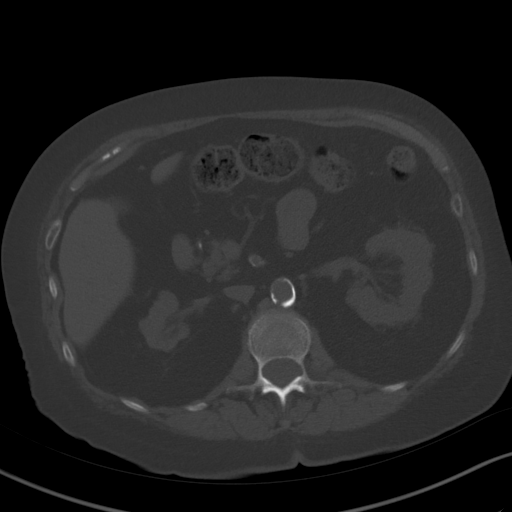
[im 74/93  soft-tissue]
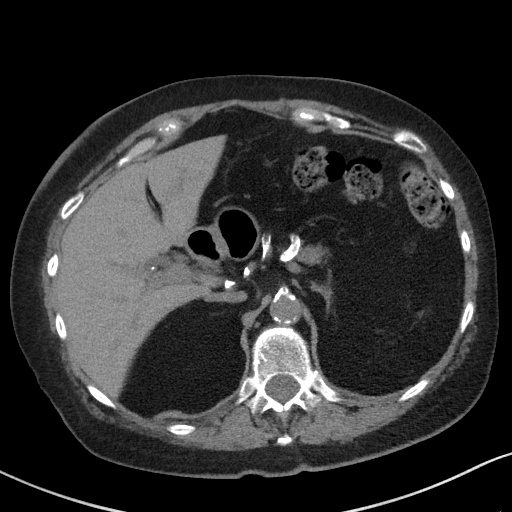
[im 78/93  lung]
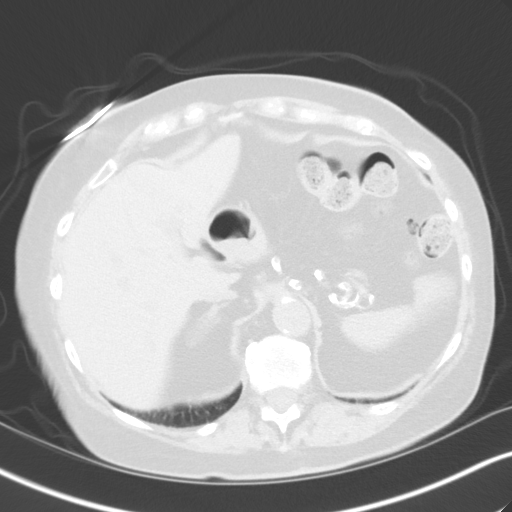
[im 81/93  soft-tissue]
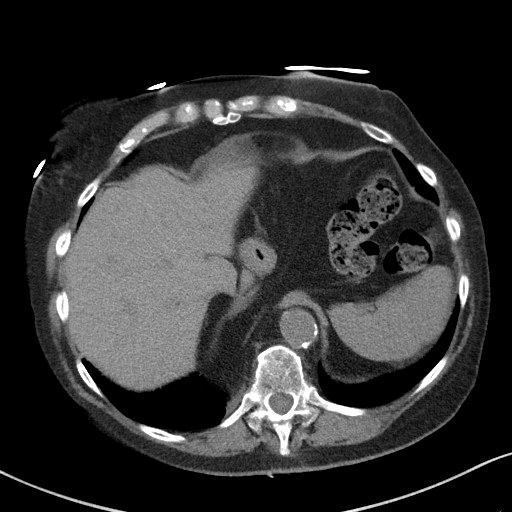
[im 81/93  lung]
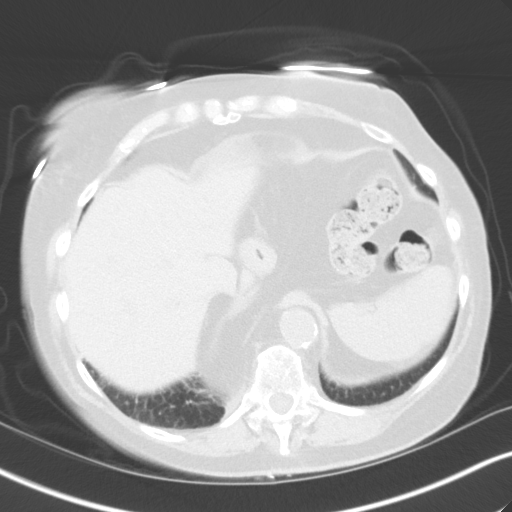
[im 85/93  lung]
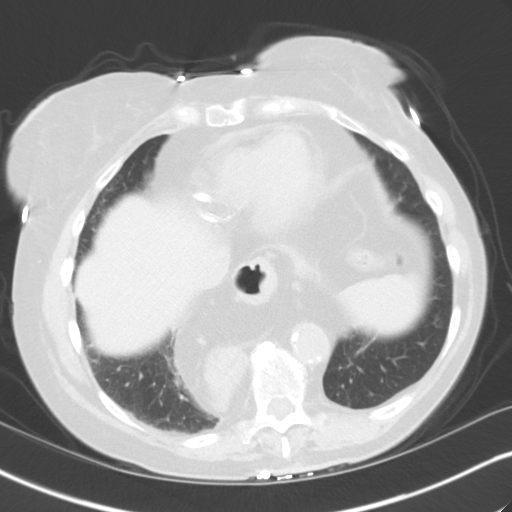
[im 89/93  soft-tissue]
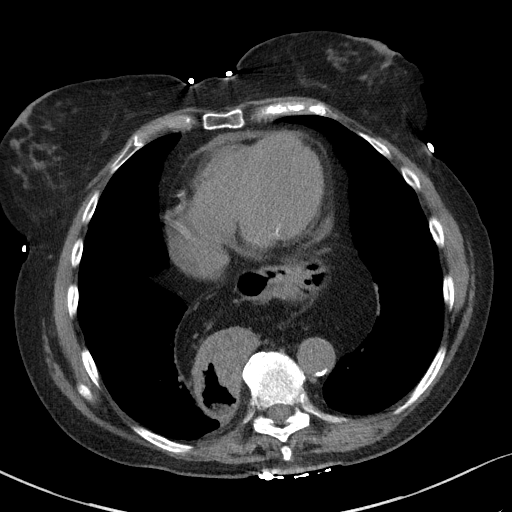
[im 89/93  lung]
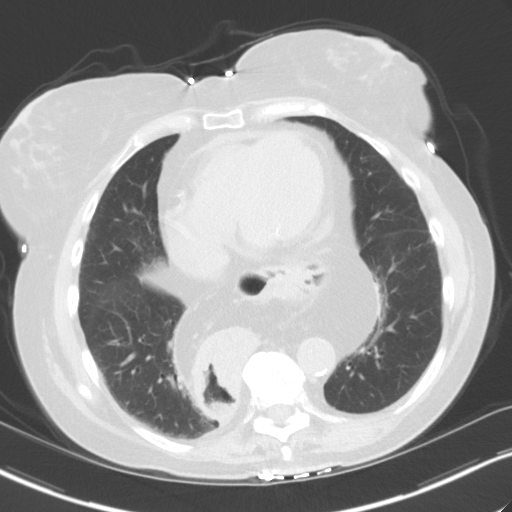

[14 of 32 positions shown; findings below may reference images not displayed]

FINDINGS: Lower chest: Atherosclerotic calcifications in the right coronary
artery. Calcifications of the mitral annulus. Large hiatal hernia
containing much of the stomach. Aortic atherosclerosis.

Hepatobiliary: No definite cystic or solid hepatic lesions are
identified on today's noncontrast CT examination. Status post
cholecystectomy.

Pancreas: No pancreatic mass. No pancreatic ductal dilatation. No
pancreatic or peripancreatic fluid or inflammatory changes.

Spleen: Unremarkable.

Adrenals/Urinary Tract: There is a right kidney, however, this is
severely atrophic. Tiny 2 mm calcification in the interpolar region
of the right kidney may be vascular, or could represent a
nonobstructive calculi. No additional calcifications are identified
within the left renal collecting system, along the course of either
ureter, or within the lumen of the urinary bladder. No
hydroureteronephrosis. Multiple large bladder diverticulae
bilaterally, measuring up to 3.7 cm on the right side. Bilateral
adrenal glands are normal in appearance.

Stomach/Bowel: Intraabdominal portion of the stomach is
unremarkable. No pathologic dilatation of small bowel or colon.
Numerous colonic diverticulae are noted, without surrounding
inflammatory changes to suggest an acute diverticulitis at this
time. Postoperative changes of partial colectomy are noted in the
region of the cecum.

Vascular/Lymphatic: Aortic atherosclerosis, without definite
aneurysm in the abdominal or pelvic vasculature. No lymphadenopathy
noted in the abdomen or pelvis.

Reproductive: Status post hysterectomy. Ovaries are not confidently
identified may be surgically absent or atrophic.

Other: No significant volume of ascites.  No pneumoperitoneum.

Musculoskeletal: There are no aggressive appearing lytic or blastic
lesions noted in the visualized portions of the skeleton.
IMPRESSION: 1. The patient does not have a solitary kidney. Rather, there is a
normal sized left kidney and a severely atrophic right kidney. In
the interpolar region of the right kidney there is a 2 mm
calcification which may be vascular, or may represent a
nonobstructive calculus. No ureteral stones or findings of urinary
tract obstruction are noted at this time.
2. Multiple large bladder wall diverticulae, measuring up to 3.7 cm
on the right side.
3. Large hiatal hernia.
4. Aortic atherosclerosis, in addition to right coronary artery
disease.
5. Colonic diverticulosis without evidence of acute diverticulitis
at this time.
6. Additional incidental findings, as above.

## 2019-04-10 IMAGING — CR DG SHOULDER 2+V*R*
3 series · 3 of 3 positions shown · non-contrast
Comparison: None.

CLINICAL DATA: Status post fall, right shoulder pain

EXAM:
RIGHT SHOULDER - 2+ VIEW

[shoulder grashey]
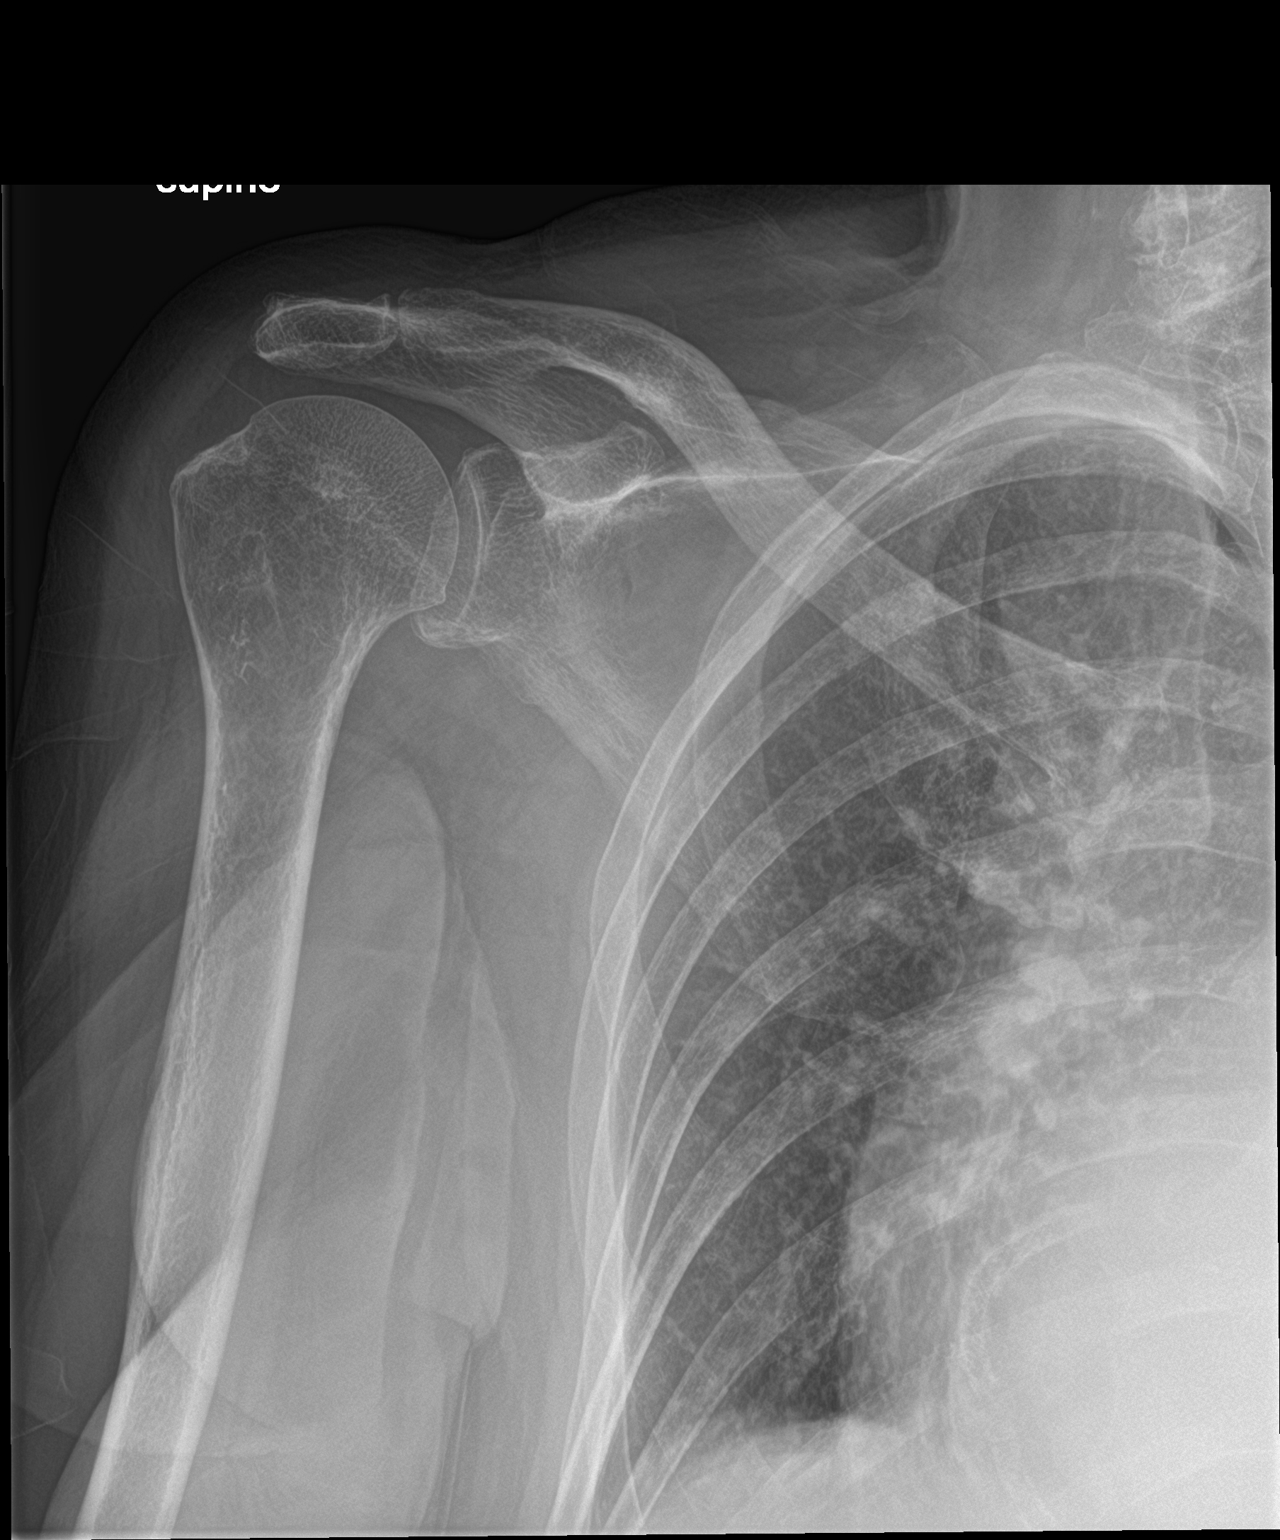

[shoulder y view]
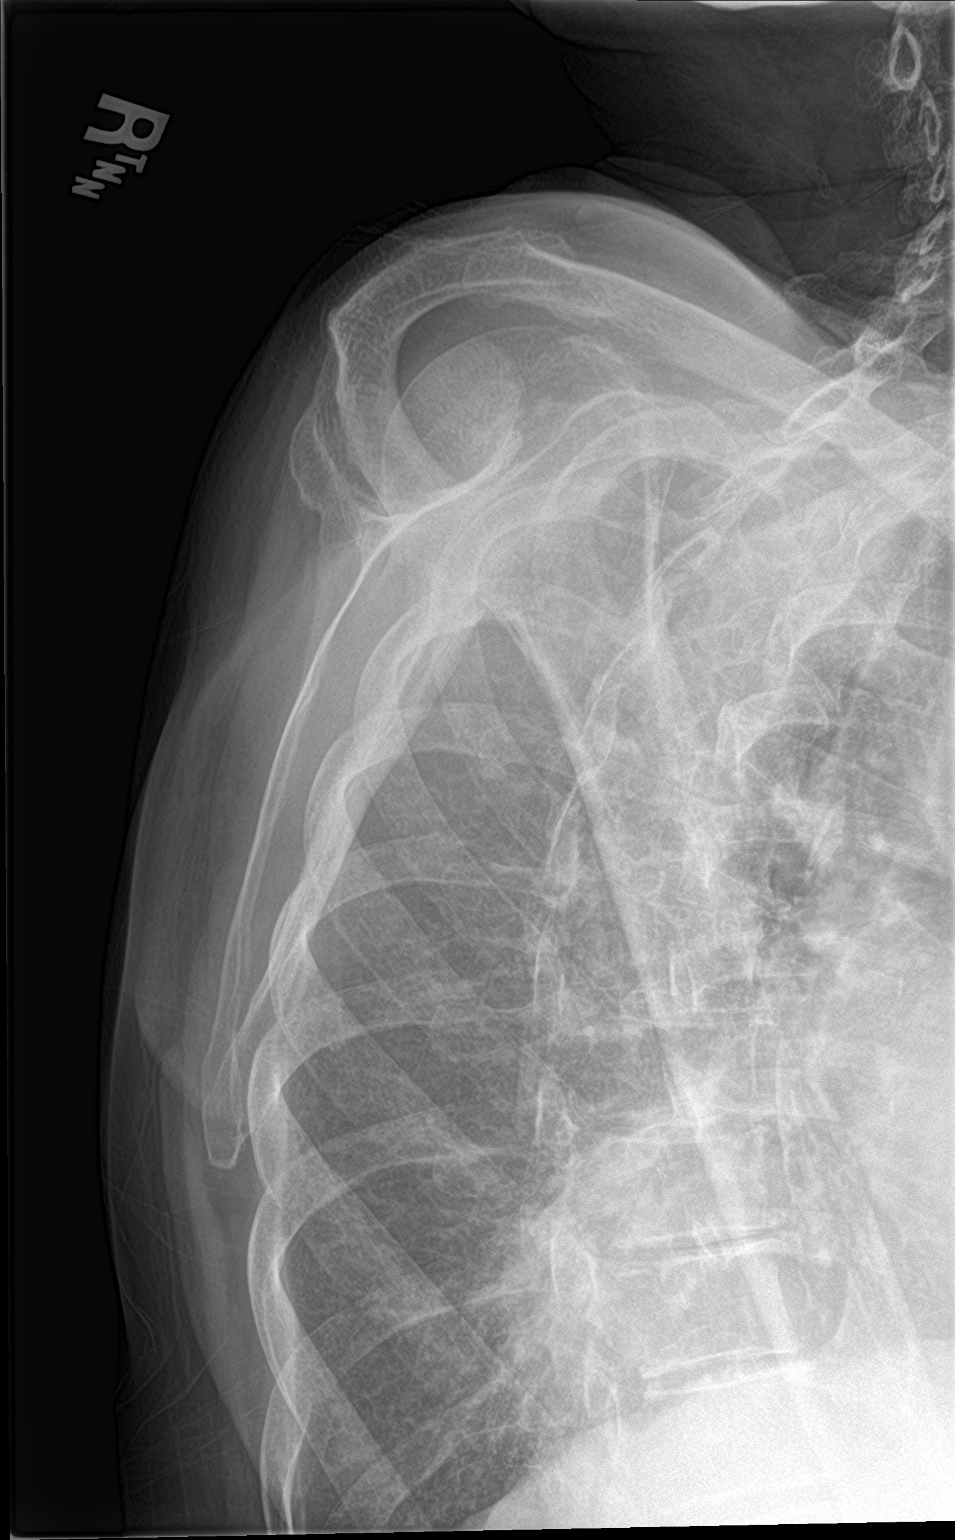

[shoulder axillary]
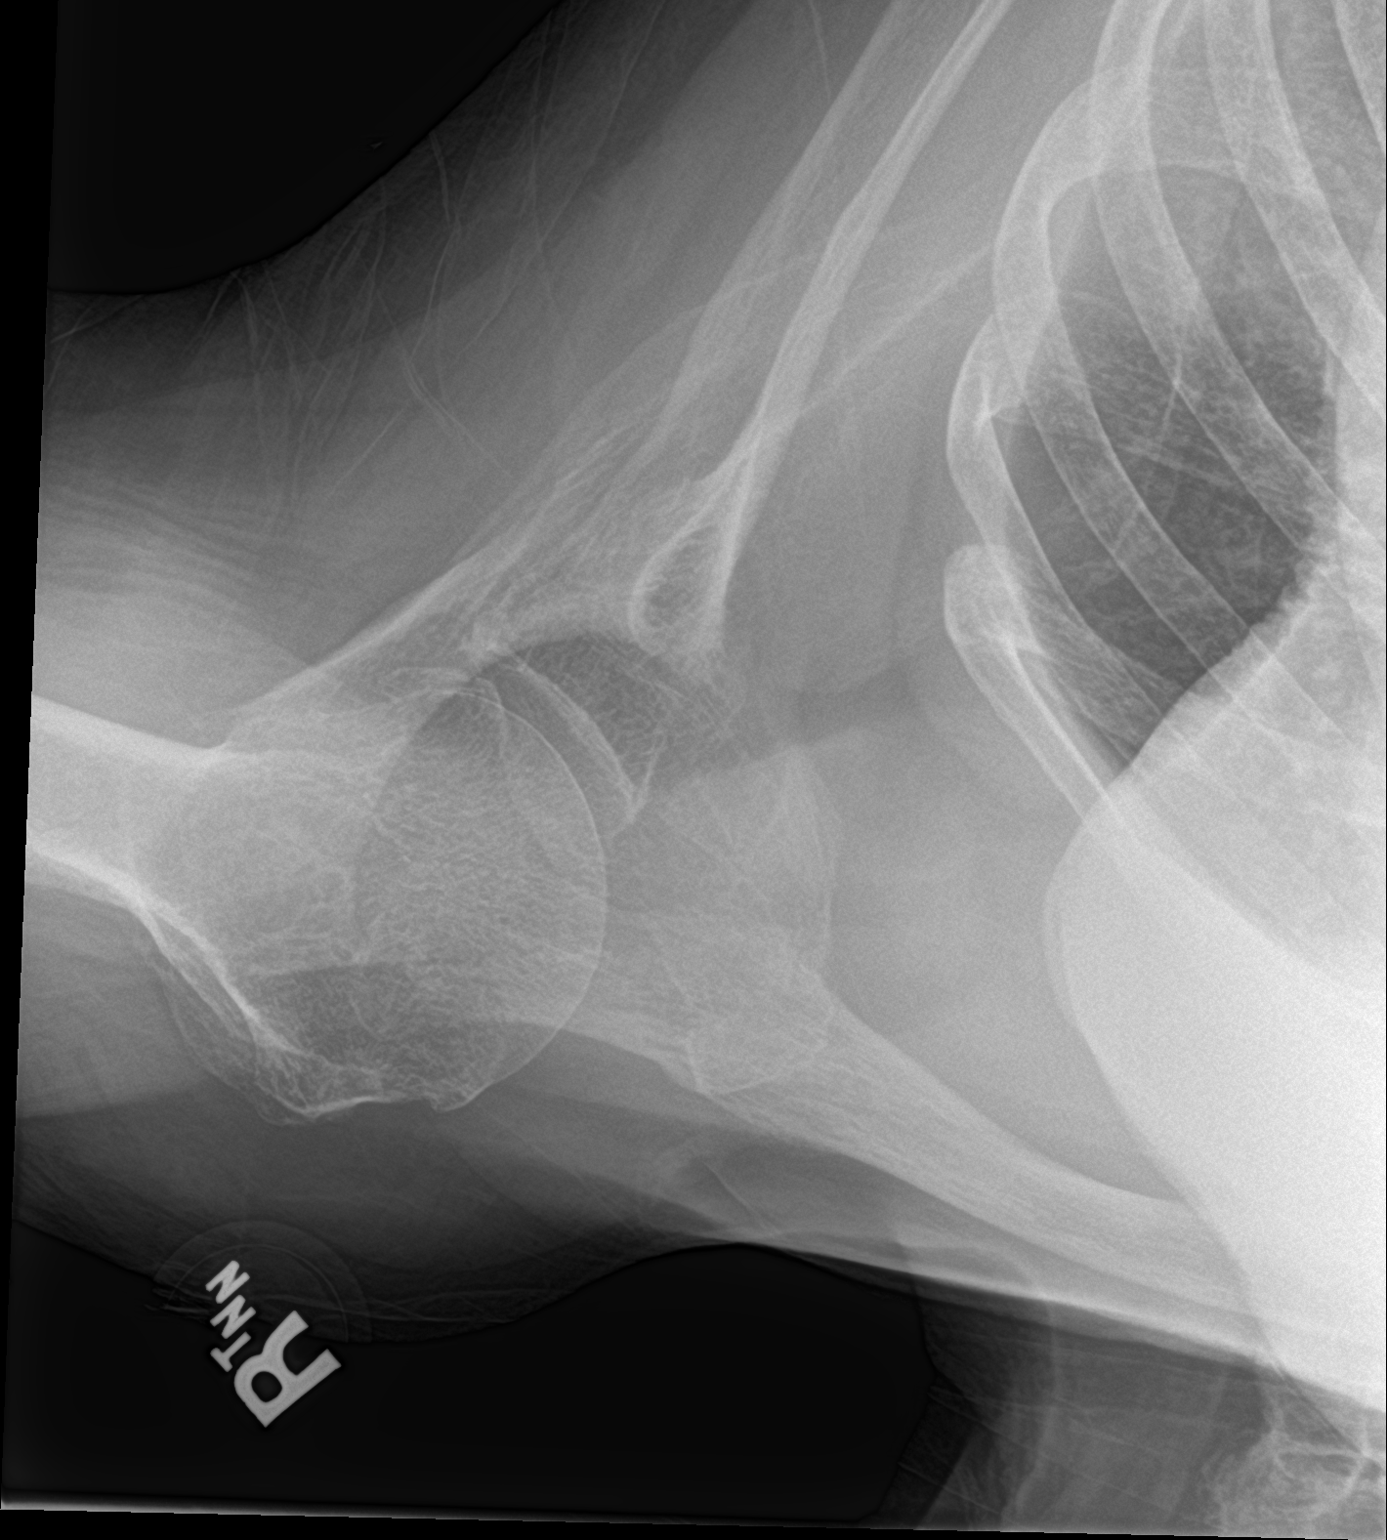

[3 of 3 positions shown; findings below may reference images not displayed]

FINDINGS: There is no fracture or dislocation. There is mild osteoarthritis of
the right glenohumeral joint. There are mild degenerative changes of
the acromioclavicular joint.
IMPRESSION: No acute osseous injury of the right shoulder.

## 2023-05-14 DEATH — deceased
